# Patient Record
Sex: Male | Born: 1960 | Race: Black or African American | Hispanic: No | Marital: Married | Smoking: Never smoker
Health system: Southern US, Community
[De-identification: ages and names within clinical notes are randomized; demographics above are authoritative.]

## PROBLEM LIST (undated history)

## (undated) DIAGNOSIS — E119 Type 2 diabetes mellitus without complications: Secondary | ICD-10-CM

## (undated) DIAGNOSIS — K219 Gastro-esophageal reflux disease without esophagitis: Secondary | ICD-10-CM

## (undated) DIAGNOSIS — E782 Mixed hyperlipidemia: Secondary | ICD-10-CM

## (undated) DIAGNOSIS — I2510 Atherosclerotic heart disease of native coronary artery without angina pectoris: Secondary | ICD-10-CM

## (undated) DIAGNOSIS — I480 Paroxysmal atrial fibrillation: Secondary | ICD-10-CM

## (undated) DIAGNOSIS — G4730 Sleep apnea, unspecified: Secondary | ICD-10-CM

## (undated) DIAGNOSIS — I10 Essential (primary) hypertension: Secondary | ICD-10-CM

## (undated) DIAGNOSIS — M109 Gout, unspecified: Secondary | ICD-10-CM

## (undated) DIAGNOSIS — I429 Cardiomyopathy, unspecified: Secondary | ICD-10-CM

## (undated) DIAGNOSIS — I499 Cardiac arrhythmia, unspecified: Secondary | ICD-10-CM

## (undated) HISTORY — DX: Mixed hyperlipidemia: E78.2

## (undated) HISTORY — DX: Type 2 diabetes mellitus without complications: E11.9

## (undated) HISTORY — DX: Cardiomyopathy, unspecified: I42.9

## (undated) HISTORY — DX: Paroxysmal atrial fibrillation: I48.0

## (undated) HISTORY — DX: Essential (primary) hypertension: I10

## (undated) HISTORY — PX: BUNIONECTOMY: SHX129

---

## 2002-12-20 ENCOUNTER — Ambulatory Visit (HOSPITAL_COMMUNITY): Admission: RE | Admit: 2002-12-20 | Discharge: 2002-12-20 | Payer: Self-pay | Admitting: Podiatry

## 2004-12-15 ENCOUNTER — Encounter (INDEPENDENT_AMBULATORY_CARE_PROVIDER_SITE_OTHER): Payer: Self-pay | Admitting: Family Medicine

## 2005-12-14 ENCOUNTER — Ambulatory Visit: Payer: Self-pay | Admitting: Family Medicine

## 2005-12-14 ENCOUNTER — Ambulatory Visit (HOSPITAL_COMMUNITY): Admission: RE | Admit: 2005-12-14 | Discharge: 2005-12-14 | Payer: Self-pay | Admitting: Family Medicine

## 2005-12-28 ENCOUNTER — Encounter (INDEPENDENT_AMBULATORY_CARE_PROVIDER_SITE_OTHER): Payer: Self-pay | Admitting: Family Medicine

## 2005-12-31 ENCOUNTER — Ambulatory Visit: Payer: Self-pay | Admitting: Family Medicine

## 2006-01-14 ENCOUNTER — Encounter (INDEPENDENT_AMBULATORY_CARE_PROVIDER_SITE_OTHER): Payer: Self-pay | Admitting: Family Medicine

## 2006-01-28 ENCOUNTER — Ambulatory Visit: Payer: Self-pay | Admitting: Family Medicine

## 2006-04-05 ENCOUNTER — Encounter (INDEPENDENT_AMBULATORY_CARE_PROVIDER_SITE_OTHER): Payer: Self-pay | Admitting: Family Medicine

## 2006-04-05 LAB — CONVERTED CEMR LAB: PSA: 0.51 ng/mL

## 2006-04-27 ENCOUNTER — Encounter: Payer: Self-pay | Admitting: Family Medicine

## 2006-04-27 DIAGNOSIS — E669 Obesity, unspecified: Secondary | ICD-10-CM

## 2006-04-27 DIAGNOSIS — M109 Gout, unspecified: Secondary | ICD-10-CM

## 2006-04-27 DIAGNOSIS — I10 Essential (primary) hypertension: Secondary | ICD-10-CM

## 2006-04-27 DIAGNOSIS — E785 Hyperlipidemia, unspecified: Secondary | ICD-10-CM | POA: Insufficient documentation

## 2006-05-10 ENCOUNTER — Ambulatory Visit: Payer: Self-pay | Admitting: Family Medicine

## 2006-05-11 ENCOUNTER — Encounter (INDEPENDENT_AMBULATORY_CARE_PROVIDER_SITE_OTHER): Payer: Self-pay | Admitting: Family Medicine

## 2006-08-30 ENCOUNTER — Telehealth (INDEPENDENT_AMBULATORY_CARE_PROVIDER_SITE_OTHER): Payer: Self-pay | Admitting: Family Medicine

## 2006-08-31 ENCOUNTER — Ambulatory Visit: Payer: Self-pay | Admitting: Family Medicine

## 2006-08-31 ENCOUNTER — Ambulatory Visit (HOSPITAL_COMMUNITY): Admission: RE | Admit: 2006-08-31 | Discharge: 2006-08-31 | Payer: Self-pay | Admitting: Family Medicine

## 2006-08-31 ENCOUNTER — Telehealth (INDEPENDENT_AMBULATORY_CARE_PROVIDER_SITE_OTHER): Payer: Self-pay | Admitting: *Deleted

## 2006-09-01 ENCOUNTER — Ambulatory Visit (HOSPITAL_COMMUNITY): Admission: RE | Admit: 2006-09-01 | Discharge: 2006-09-01 | Payer: Self-pay | Admitting: Family Medicine

## 2006-09-03 ENCOUNTER — Encounter (INDEPENDENT_AMBULATORY_CARE_PROVIDER_SITE_OTHER): Payer: Self-pay | Admitting: Family Medicine

## 2006-09-06 ENCOUNTER — Encounter (INDEPENDENT_AMBULATORY_CARE_PROVIDER_SITE_OTHER): Payer: Self-pay | Admitting: Family Medicine

## 2006-09-14 ENCOUNTER — Ambulatory Visit: Payer: Self-pay | Admitting: Family Medicine

## 2006-09-14 LAB — CONVERTED CEMR LAB: Cholesterol, target level: 200 mg/dL

## 2006-10-15 ENCOUNTER — Encounter (INDEPENDENT_AMBULATORY_CARE_PROVIDER_SITE_OTHER): Payer: Self-pay | Admitting: Family Medicine

## 2006-10-21 ENCOUNTER — Encounter (INDEPENDENT_AMBULATORY_CARE_PROVIDER_SITE_OTHER): Payer: Self-pay | Admitting: Family Medicine

## 2006-10-25 ENCOUNTER — Ambulatory Visit: Payer: Self-pay | Admitting: Family Medicine

## 2007-05-17 ENCOUNTER — Ambulatory Visit: Payer: Self-pay | Admitting: Family Medicine

## 2007-05-30 ENCOUNTER — Encounter (INDEPENDENT_AMBULATORY_CARE_PROVIDER_SITE_OTHER): Payer: Self-pay | Admitting: Family Medicine

## 2007-05-31 ENCOUNTER — Telehealth (INDEPENDENT_AMBULATORY_CARE_PROVIDER_SITE_OTHER): Payer: Self-pay | Admitting: *Deleted

## 2007-05-31 LAB — CONVERTED CEMR LAB
AST: 14 units/L (ref 0–37)
Albumin: 4.4 g/dL (ref 3.5–5.2)
Alkaline Phosphatase: 90 units/L (ref 39–117)
Basophils Absolute: 0 10*3/uL (ref 0.0–0.1)
Basophils Relative: 0 % (ref 0–1)
Eosinophils Absolute: 0.2 10*3/uL (ref 0.0–0.7)
Eosinophils Relative: 1 % (ref 0–5)
Glucose, Bld: 107 mg/dL — ABNORMAL HIGH (ref 70–99)
HCT: 45.1 % (ref 39.0–52.0)
LDL Cholesterol: 96 mg/dL (ref 0–99)
Lymphs Abs: 2.1 10*3/uL (ref 0.7–4.0)
MCHC: 33.3 g/dL (ref 30.0–36.0)
MCV: 90.6 fL (ref 78.0–100.0)
Neutrophils Relative %: 71 % (ref 43–77)
PSA: 0.45 ng/mL (ref 0.10–4.00)
Platelets: 230 10*3/uL (ref 150–400)
Potassium: 4 meq/L (ref 3.5–5.3)
RDW: 13 % (ref 11.5–15.5)
Sodium: 141 meq/L (ref 135–145)
Total Bilirubin: 0.7 mg/dL (ref 0.3–1.2)
Total Protein: 7.7 g/dL (ref 6.0–8.3)
Triglycerides: 169 mg/dL — ABNORMAL HIGH (ref <150)
Uric Acid, Serum: 8.2 mg/dL — ABNORMAL HIGH (ref 4.0–7.8)
VLDL: 34 mg/dL (ref 0–40)
WBC: 10.4 10*3/uL (ref 4.0–10.5)

## 2007-06-14 ENCOUNTER — Telehealth (INDEPENDENT_AMBULATORY_CARE_PROVIDER_SITE_OTHER): Payer: Self-pay | Admitting: *Deleted

## 2007-06-14 ENCOUNTER — Ambulatory Visit: Payer: Self-pay | Admitting: Family Medicine

## 2007-06-14 DIAGNOSIS — R7309 Other abnormal glucose: Secondary | ICD-10-CM | POA: Insufficient documentation

## 2007-06-17 ENCOUNTER — Encounter (INDEPENDENT_AMBULATORY_CARE_PROVIDER_SITE_OTHER): Payer: Self-pay | Admitting: Family Medicine

## 2007-07-26 ENCOUNTER — Ambulatory Visit: Payer: Self-pay | Admitting: Family Medicine

## 2007-07-27 ENCOUNTER — Encounter (INDEPENDENT_AMBULATORY_CARE_PROVIDER_SITE_OTHER): Payer: Self-pay | Admitting: Family Medicine

## 2007-07-27 ENCOUNTER — Telehealth (INDEPENDENT_AMBULATORY_CARE_PROVIDER_SITE_OTHER): Payer: Self-pay | Admitting: *Deleted

## 2007-07-27 LAB — CONVERTED CEMR LAB
CO2: 21 meq/L (ref 19–32)
Calcium: 9.3 mg/dL (ref 8.4–10.5)
Creatinine, Ser: 0.98 mg/dL (ref 0.40–1.50)
Glucose, Bld: 100 mg/dL — ABNORMAL HIGH (ref 70–99)
Uric Acid, Serum: 7.1 mg/dL (ref 4.0–7.8)

## 2007-08-29 ENCOUNTER — Ambulatory Visit: Payer: Self-pay | Admitting: Family Medicine

## 2007-08-29 ENCOUNTER — Telehealth (INDEPENDENT_AMBULATORY_CARE_PROVIDER_SITE_OTHER): Payer: Self-pay | Admitting: *Deleted

## 2007-08-29 DIAGNOSIS — R0989 Other specified symptoms and signs involving the circulatory and respiratory systems: Secondary | ICD-10-CM

## 2007-08-29 DIAGNOSIS — R0609 Other forms of dyspnea: Secondary | ICD-10-CM | POA: Insufficient documentation

## 2007-08-31 ENCOUNTER — Ambulatory Visit (HOSPITAL_COMMUNITY): Admission: RE | Admit: 2007-08-31 | Discharge: 2007-08-31 | Payer: Self-pay | Admitting: Family Medicine

## 2007-09-01 ENCOUNTER — Telehealth (INDEPENDENT_AMBULATORY_CARE_PROVIDER_SITE_OTHER): Payer: Self-pay | Admitting: *Deleted

## 2007-09-02 ENCOUNTER — Encounter (INDEPENDENT_AMBULATORY_CARE_PROVIDER_SITE_OTHER): Payer: Self-pay | Admitting: Family Medicine

## 2007-09-06 ENCOUNTER — Encounter (INDEPENDENT_AMBULATORY_CARE_PROVIDER_SITE_OTHER): Payer: Self-pay | Admitting: Family Medicine

## 2007-09-15 LAB — CONVERTED CEMR LAB
Catecholamines Tot(E+NE) 24 Hr U: 0.136 mg/24hr — ABNORMAL HIGH
Creatinine, Urine: 170.3 mg/dL
Dopamine 24 Hr Urine: 685 mcg/24hr — ABNORMAL HIGH (ref <500)
Epinephrine 24 Hr Urine: 13 mcg/24hr (ref <20)
Volume, Urine-CORTUR: 1600 mL

## 2007-09-16 ENCOUNTER — Encounter (INDEPENDENT_AMBULATORY_CARE_PROVIDER_SITE_OTHER): Payer: Self-pay | Admitting: Family Medicine

## 2007-09-16 ENCOUNTER — Telehealth (INDEPENDENT_AMBULATORY_CARE_PROVIDER_SITE_OTHER): Payer: Self-pay | Admitting: Family Medicine

## 2007-09-16 DIAGNOSIS — R809 Proteinuria, unspecified: Secondary | ICD-10-CM | POA: Insufficient documentation

## 2007-09-26 ENCOUNTER — Ambulatory Visit: Payer: Self-pay | Admitting: Family Medicine

## 2007-09-30 ENCOUNTER — Encounter (INDEPENDENT_AMBULATORY_CARE_PROVIDER_SITE_OTHER): Payer: Self-pay | Admitting: Internal Medicine

## 2007-10-11 ENCOUNTER — Telehealth (INDEPENDENT_AMBULATORY_CARE_PROVIDER_SITE_OTHER): Payer: Self-pay | Admitting: Family Medicine

## 2007-10-18 ENCOUNTER — Encounter (INDEPENDENT_AMBULATORY_CARE_PROVIDER_SITE_OTHER): Payer: Self-pay | Admitting: Family Medicine

## 2007-11-04 ENCOUNTER — Encounter (INDEPENDENT_AMBULATORY_CARE_PROVIDER_SITE_OTHER): Payer: Self-pay | Admitting: Family Medicine

## 2007-11-16 ENCOUNTER — Telehealth (INDEPENDENT_AMBULATORY_CARE_PROVIDER_SITE_OTHER): Payer: Self-pay | Admitting: Family Medicine

## 2007-11-16 ENCOUNTER — Encounter (INDEPENDENT_AMBULATORY_CARE_PROVIDER_SITE_OTHER): Payer: Self-pay | Admitting: Family Medicine

## 2007-12-06 ENCOUNTER — Ambulatory Visit: Payer: Self-pay | Admitting: Family Medicine

## 2007-12-13 ENCOUNTER — Encounter (INDEPENDENT_AMBULATORY_CARE_PROVIDER_SITE_OTHER): Payer: Self-pay | Admitting: Family Medicine

## 2007-12-23 ENCOUNTER — Encounter (INDEPENDENT_AMBULATORY_CARE_PROVIDER_SITE_OTHER): Payer: Self-pay | Admitting: Family Medicine

## 2007-12-27 ENCOUNTER — Ambulatory Visit: Payer: Self-pay | Admitting: Family Medicine

## 2007-12-30 ENCOUNTER — Ambulatory Visit: Payer: Self-pay | Admitting: Family Medicine

## 2008-01-24 ENCOUNTER — Ambulatory Visit: Payer: Self-pay | Admitting: Family Medicine

## 2008-01-26 ENCOUNTER — Encounter (INDEPENDENT_AMBULATORY_CARE_PROVIDER_SITE_OTHER): Payer: Self-pay | Admitting: Family Medicine

## 2008-01-26 LAB — CONVERTED CEMR LAB
BUN: 12 mg/dL (ref 6–23)
CO2: 21 meq/L (ref 19–32)
Glucose, Bld: 89 mg/dL (ref 70–99)
Potassium: 3.7 meq/L (ref 3.5–5.3)
Sodium: 139 meq/L (ref 135–145)

## 2008-02-22 ENCOUNTER — Ambulatory Visit: Payer: Self-pay | Admitting: Internal Medicine

## 2008-02-22 ENCOUNTER — Encounter (INDEPENDENT_AMBULATORY_CARE_PROVIDER_SITE_OTHER): Payer: Self-pay | Admitting: Family Medicine

## 2008-02-23 LAB — CONVERTED CEMR LAB: Monocyte/Macrophage: 2 % — ABNORMAL LOW (ref 50–90)

## 2008-02-28 ENCOUNTER — Ambulatory Visit: Payer: Self-pay | Admitting: Internal Medicine

## 2008-04-24 ENCOUNTER — Ambulatory Visit: Payer: Self-pay | Admitting: Family Medicine

## 2008-05-08 ENCOUNTER — Encounter (INDEPENDENT_AMBULATORY_CARE_PROVIDER_SITE_OTHER): Payer: Self-pay | Admitting: Family Medicine

## 2008-05-22 ENCOUNTER — Ambulatory Visit: Payer: Self-pay | Admitting: Family Medicine

## 2008-05-25 ENCOUNTER — Encounter (INDEPENDENT_AMBULATORY_CARE_PROVIDER_SITE_OTHER): Payer: Self-pay | Admitting: Family Medicine

## 2008-05-28 LAB — CONVERTED CEMR LAB
ALT: 28 units/L (ref 0–53)
Basophils Absolute: 0 10*3/uL (ref 0.0–0.1)
CO2: 22 meq/L (ref 19–32)
Calcium: 8.9 mg/dL (ref 8.4–10.5)
Chloride: 106 meq/L (ref 96–112)
Cholesterol: 165 mg/dL (ref 0–200)
Creatinine, Ser: 0.8 mg/dL (ref 0.40–1.50)
Glucose, Bld: 133 mg/dL — ABNORMAL HIGH (ref 70–99)
Hemoglobin: 14.5 g/dL (ref 13.0–17.0)
Lymphocytes Relative: 22 % (ref 12–46)
Lymphs Abs: 2.1 10*3/uL (ref 0.7–4.0)
Monocytes Absolute: 0.6 10*3/uL (ref 0.1–1.0)
Monocytes Relative: 7 % (ref 3–12)
Neutro Abs: 6.7 10*3/uL (ref 1.7–7.7)
PSA: 0.44 ng/mL (ref 0.10–4.00)
RBC: 4.85 M/uL (ref 4.22–5.81)
RDW: 13.8 % (ref 11.5–15.5)
TSH: 1.464 microintl units/mL (ref 0.350–4.50)
Total CHOL/HDL Ratio: 3.1
WBC: 9.7 10*3/uL (ref 4.0–10.5)

## 2008-06-22 ENCOUNTER — Ambulatory Visit: Payer: Self-pay | Admitting: Family Medicine

## 2008-06-22 LAB — CONVERTED CEMR LAB: Hgb A1c MFr Bld: 6.3 %

## 2008-07-20 ENCOUNTER — Ambulatory Visit: Payer: Self-pay | Admitting: Family Medicine

## 2008-07-20 LAB — CONVERTED CEMR LAB
Bilirubin Urine: NEGATIVE
Blood in Urine, dipstick: NEGATIVE
Urobilinogen, UA: 1
pH: 5.5

## 2008-07-25 ENCOUNTER — Encounter (INDEPENDENT_AMBULATORY_CARE_PROVIDER_SITE_OTHER): Payer: Self-pay | Admitting: Family Medicine

## 2008-07-30 ENCOUNTER — Encounter (INDEPENDENT_AMBULATORY_CARE_PROVIDER_SITE_OTHER): Payer: Self-pay | Admitting: Family Medicine

## 2008-07-31 ENCOUNTER — Encounter (INDEPENDENT_AMBULATORY_CARE_PROVIDER_SITE_OTHER): Payer: Self-pay | Admitting: Family Medicine

## 2008-08-17 ENCOUNTER — Ambulatory Visit: Payer: Self-pay | Admitting: Family Medicine

## 2008-08-17 DIAGNOSIS — G4736 Sleep related hypoventilation in conditions classified elsewhere: Secondary | ICD-10-CM

## 2008-08-18 ENCOUNTER — Encounter (INDEPENDENT_AMBULATORY_CARE_PROVIDER_SITE_OTHER): Payer: Self-pay | Admitting: Family Medicine

## 2008-08-20 ENCOUNTER — Encounter (INDEPENDENT_AMBULATORY_CARE_PROVIDER_SITE_OTHER): Payer: Self-pay | Admitting: *Deleted

## 2008-08-20 LAB — CONVERTED CEMR LAB
Calcium: 9.2 mg/dL (ref 8.4–10.5)
Creatinine, Ser: 0.9 mg/dL (ref 0.40–1.50)

## 2008-09-07 ENCOUNTER — Ambulatory Visit: Admission: RE | Admit: 2008-09-07 | Discharge: 2008-09-07 | Payer: Self-pay | Admitting: Family Medicine

## 2008-09-07 ENCOUNTER — Encounter (INDEPENDENT_AMBULATORY_CARE_PROVIDER_SITE_OTHER): Payer: Self-pay | Admitting: Family Medicine

## 2008-09-14 ENCOUNTER — Ambulatory Visit: Payer: Self-pay | Admitting: Family Medicine

## 2008-09-17 ENCOUNTER — Ambulatory Visit: Payer: Self-pay | Admitting: Pulmonary Disease

## 2008-09-17 ENCOUNTER — Encounter (INDEPENDENT_AMBULATORY_CARE_PROVIDER_SITE_OTHER): Payer: Self-pay | Admitting: Family Medicine

## 2008-09-18 ENCOUNTER — Telehealth (INDEPENDENT_AMBULATORY_CARE_PROVIDER_SITE_OTHER): Payer: Self-pay | Admitting: *Deleted

## 2008-09-18 ENCOUNTER — Encounter (INDEPENDENT_AMBULATORY_CARE_PROVIDER_SITE_OTHER): Payer: Self-pay | Admitting: Family Medicine

## 2008-09-24 ENCOUNTER — Encounter (INDEPENDENT_AMBULATORY_CARE_PROVIDER_SITE_OTHER): Payer: Self-pay | Admitting: Family Medicine

## 2008-09-26 ENCOUNTER — Encounter (INDEPENDENT_AMBULATORY_CARE_PROVIDER_SITE_OTHER): Payer: Self-pay | Admitting: Family Medicine

## 2008-09-27 ENCOUNTER — Encounter (INDEPENDENT_AMBULATORY_CARE_PROVIDER_SITE_OTHER): Payer: Self-pay | Admitting: Family Medicine

## 2008-10-05 ENCOUNTER — Encounter (INDEPENDENT_AMBULATORY_CARE_PROVIDER_SITE_OTHER): Payer: Self-pay | Admitting: Family Medicine

## 2008-10-26 ENCOUNTER — Ambulatory Visit: Payer: Self-pay | Admitting: Family Medicine

## 2008-10-27 ENCOUNTER — Encounter (INDEPENDENT_AMBULATORY_CARE_PROVIDER_SITE_OTHER): Payer: Self-pay | Admitting: Family Medicine

## 2008-10-30 LAB — CONVERTED CEMR LAB
ALT: 28 units/L (ref 0–53)
AST: 22 units/L (ref 0–37)
Albumin: 4.2 g/dL (ref 3.5–5.2)
Calcium: 9.4 mg/dL (ref 8.4–10.5)
Chloride: 107 meq/L (ref 96–112)
Potassium: 4.1 meq/L (ref 3.5–5.3)
Sodium: 139 meq/L (ref 135–145)

## 2008-12-04 ENCOUNTER — Encounter (INDEPENDENT_AMBULATORY_CARE_PROVIDER_SITE_OTHER): Payer: Self-pay | Admitting: Family Medicine

## 2008-12-07 ENCOUNTER — Telehealth (INDEPENDENT_AMBULATORY_CARE_PROVIDER_SITE_OTHER): Payer: Self-pay | Admitting: Family Medicine

## 2008-12-07 ENCOUNTER — Ambulatory Visit: Payer: Self-pay | Admitting: Family Medicine

## 2009-01-04 ENCOUNTER — Encounter (INDEPENDENT_AMBULATORY_CARE_PROVIDER_SITE_OTHER): Payer: Self-pay | Admitting: Family Medicine

## 2010-06-08 LAB — CONVERTED CEMR LAB
RBC count: 4.93 10*6/uL
WBC, blood: 17.47 10*3/uL

## 2010-09-03 ENCOUNTER — Inpatient Hospital Stay (HOSPITAL_COMMUNITY)
Admission: EM | Admit: 2010-09-03 | Discharge: 2010-09-06 | DRG: 554 | Disposition: A | Discharge status: Home / Self Care | Payer: Self-pay | Attending: Internal Medicine | Admitting: Internal Medicine

## 2010-09-03 ENCOUNTER — Emergency Department (HOSPITAL_COMMUNITY): Payer: Self-pay

## 2010-09-03 DIAGNOSIS — E876 Hypokalemia: Secondary | ICD-10-CM | POA: Diagnosis present

## 2010-09-03 DIAGNOSIS — E785 Hyperlipidemia, unspecified: Secondary | ICD-10-CM | POA: Diagnosis present

## 2010-09-03 DIAGNOSIS — M109 Gout, unspecified: Principal | ICD-10-CM | POA: Diagnosis present

## 2010-09-03 DIAGNOSIS — Z91199 Patient's noncompliance with other medical treatment and regimen due to unspecified reason: Secondary | ICD-10-CM

## 2010-09-03 DIAGNOSIS — I10 Essential (primary) hypertension: Secondary | ICD-10-CM | POA: Diagnosis present

## 2010-09-03 DIAGNOSIS — Z9119 Patient's noncompliance with other medical treatment and regimen: Secondary | ICD-10-CM

## 2010-09-03 DIAGNOSIS — E119 Type 2 diabetes mellitus without complications: Secondary | ICD-10-CM | POA: Diagnosis present

## 2010-09-03 DIAGNOSIS — G4733 Obstructive sleep apnea (adult) (pediatric): Secondary | ICD-10-CM | POA: Diagnosis present

## 2010-09-03 DIAGNOSIS — N179 Acute kidney failure, unspecified: Secondary | ICD-10-CM | POA: Diagnosis not present

## 2010-09-03 LAB — DIFFERENTIAL
Basophils Relative: 0 % (ref 0–1)
Lymphs Abs: 2.1 10*3/uL (ref 0.7–4.0)
Monocytes Relative: 10 % (ref 3–12)
Neutro Abs: 16.5 10*3/uL — ABNORMAL HIGH (ref 1.7–7.7)

## 2010-09-03 LAB — BASIC METABOLIC PANEL
BUN: 11 mg/dL (ref 6–23)
Creatinine, Ser: 1.04 mg/dL (ref 0.4–1.5)
GFR calc non Af Amer: >60 mL/min (ref >60)

## 2010-09-03 LAB — URINALYSIS, ROUTINE W REFLEX MICROSCOPIC
Ketones, ur: NEGATIVE mg/dL
Nitrite: NEGATIVE
Protein, ur: >300 mg/dL — AB
Urobilinogen, UA: 1 mg/dL (ref 0.0–1.0)

## 2010-09-03 LAB — LACTIC ACID, PLASMA: Lactic Acid, Venous: 1.5 mmol/L (ref 0.5–2.2)

## 2010-09-03 LAB — SYNOVIAL CELL COUNT + DIFF, W/ CRYSTALS
Eosinophils-Synovial: 0 % (ref 0–1)
Monocyte-Macrophage-Synovial Fluid: 2 % — ABNORMAL LOW (ref 50–90)

## 2010-09-03 LAB — URINE MICROSCOPIC-ADD ON

## 2010-09-03 LAB — CBC
Hemoglobin: 14.7 g/dL (ref 13.0–17.0)
MCV: 89 fL (ref 78.0–100.0)
Platelets: 218 10*3/uL (ref 150–400)
RBC: 4.89 MIL/uL (ref 4.22–5.81)
WBC: 20.7 10*3/uL — ABNORMAL HIGH (ref 4.0–10.5)

## 2010-09-04 LAB — DIFFERENTIAL
Basophils Relative: 0 % (ref 0–1)
Eosinophils Absolute: 0.1 10*3/uL (ref 0.0–0.7)
Monocytes Absolute: 1.7 10*3/uL — ABNORMAL HIGH (ref 0.1–1.0)
Monocytes Relative: 12 % (ref 3–12)

## 2010-09-04 LAB — BASIC METABOLIC PANEL
BUN: 15 mg/dL (ref 6–23)
CO2: 26 mEq/L (ref 19–32)
Chloride: 101 mEq/L (ref 96–112)
Creatinine, Ser: 1.36 mg/dL (ref 0.4–1.5)

## 2010-09-04 LAB — CBC
MCH: 29.9 pg (ref 26.0–34.0)
MCHC: 33.2 g/dL (ref 30.0–36.0)
Platelets: 181 10*3/uL (ref 150–400)
RDW: 13.4 % (ref 11.5–15.5)

## 2010-09-04 LAB — GLUCOSE, CAPILLARY
Glucose-Capillary: 132 mg/dL — ABNORMAL HIGH (ref 70–99)
Glucose-Capillary: 124 mg/dL — ABNORMAL HIGH (ref 70–99)
Glucose-Capillary: 113 mg/dL — ABNORMAL HIGH (ref 70–99)

## 2010-09-04 LAB — CARDIAC PANEL(CRET KIN+CKTOT+MB+TROPI)
Relative Index: 0.8 (ref 0.0–2.5)
Relative Index: 1 (ref 0.0–2.5)
Troponin I: 0.03 ng/mL (ref 0.00–0.06)
Troponin I: 0.05 ng/mL (ref 0.00–0.06)

## 2010-09-05 LAB — DIFFERENTIAL
Basophils Absolute: 0 10*3/uL (ref 0.0–0.1)
Eosinophils Absolute: 0.3 10*3/uL (ref 0.0–0.7)
Lymphs Abs: 2.9 10*3/uL (ref 0.7–4.0)
Monocytes Relative: 11 % (ref 3–12)
Neutro Abs: 8.9 10*3/uL — ABNORMAL HIGH (ref 1.7–7.7)

## 2010-09-05 LAB — CBC
Hemoglobin: 12.4 g/dL — ABNORMAL LOW (ref 13.0–17.0)
MCH: 30 pg (ref 26.0–34.0)
MCHC: 33.2 g/dL (ref 30.0–36.0)

## 2010-09-05 LAB — GLUCOSE, CAPILLARY: Glucose-Capillary: 119 mg/dL — ABNORMAL HIGH (ref 70–99)

## 2010-09-05 LAB — BASIC METABOLIC PANEL
BUN: 33 mg/dL — ABNORMAL HIGH (ref 6–23)
CO2: 23 mEq/L (ref 19–32)
Calcium: 8.7 mg/dL (ref 8.4–10.5)
Creatinine, Ser: 1.6 mg/dL — ABNORMAL HIGH (ref 0.4–1.5)
GFR calc Af Amer: 56 mL/min — ABNORMAL LOW (ref >60)

## 2010-09-05 LAB — LIPID PANEL: Cholesterol: 154 mg/dL (ref 0–200)

## 2010-09-06 LAB — BASIC METABOLIC PANEL WITH GFR
BUN: 21 mg/dL (ref 6–23)
CO2: 25 meq/L (ref 19–32)
Calcium: 8.9 mg/dL (ref 8.4–10.5)
Chloride: 105 meq/L (ref 96–112)
Creatinine, Ser: 1.06 mg/dL (ref 0.4–1.5)
GFR calc non Af Amer: >60 mL/min
Glucose, Bld: 117 mg/dL — ABNORMAL HIGH (ref 70–99)
Potassium: 3.5 meq/L (ref 3.5–5.1)
Sodium: 138 meq/L (ref 135–145)

## 2010-09-06 LAB — CBC
MCH: 30 pg (ref 26.0–34.0)
Platelets: 237 10*3/uL (ref 150–400)
RBC: 4.43 MIL/uL (ref 4.22–5.81)
RDW: 13.2 % (ref 11.5–15.5)
WBC: 12.5 10*3/uL — ABNORMAL HIGH (ref 4.0–10.5)

## 2010-09-06 LAB — DIFFERENTIAL
Basophils Absolute: 0 K/uL (ref 0.0–0.1)
Basophils Relative: 0 % (ref 0–1)
Eosinophils Absolute: 0.3 K/uL (ref 0.0–0.7)
Eosinophils Relative: 2 % (ref 0–5)
Lymphocytes Relative: 20 % (ref 12–46)
Lymphs Abs: 2.5 K/uL (ref 0.7–4.0)
Monocytes Absolute: 1.1 K/uL — ABNORMAL HIGH (ref 0.1–1.0)
Monocytes Relative: 9 % (ref 3–12)
Neutro Abs: 8.6 K/uL — ABNORMAL HIGH (ref 1.7–7.7)
Neutrophils Relative %: 69 % (ref 43–77)

## 2010-09-06 LAB — GLUCOSE, CAPILLARY: Glucose-Capillary: 118 mg/dL — ABNORMAL HIGH (ref 70–99)

## 2010-09-06 NOTE — H&P (Signed)
NAMEEMERALD, GEHRES          ACCOUNT NO.:  [id number]  MEDICAL RECORD NO.:  [id number]           PATIENT TYPE:  I  LOCATION:  A336                          FACILITY:  [facility]  PHYSICIAN:  Tarry Kos, MD       DATE OF BIRTH:  1961/02/22  DATE OF ADMISSION:  09/03/2010 DATE OF DISCHARGE:  LH                             HISTORY & PHYSICAL   CHIEF COMPLAINT:  Left knee pain.  HISTORY OF PRESENT ILLNESS:  Mr. Tinnie Gens is a 50 year old African American male who has a history of a high blood pressure, gout, non- insulin-dependent diabetes, sleep apnea, hypertension and hyperlipidemia who presents to the emergency department because of left knee pain from his primary care physician's office.  He had systolic blood pressures round 200 and was told to come to the emergency department.  He was diagnosed with a gouty flare in his left knee.  However, due to his uncontrolled blood pressure and tachycardia, we were asked to admit the patient.  He ran out of his blood pressure medications less than a week ago.  He denies any fevers.  He states he gets gout frequently usually in his big toe, but he has had it in the same knee several times.  His knee was tapped in emergency department and it is consistent with a gouty flare and it has crystals in the synovial fluid.  REVIEW OF SYSTEMS:  Otherwise, negative.  PAST MEDICAL HISTORY:  Hypokalemia, non-insulin-dependent diabetes, malignant hypertension with a history of medical noncompliance, chronic gout, sleep apnea, hyperlipidemia and hypertension.  MEDICATIONS: 1. He is on Eucerin plus twice a day. 2. Clonidine 0.3 mg 3 times a day. 3. Pravachol 40 mg nightly. 4. Aspirin 81 mg a day. 5. Allopurinol 100 mg a day. 6. Norvasc 10 mg a day. 7. Coreg 25 mg twice a day. 8. Lisinopril/hydrochlorothiazide 20/12.5 mg 2 p.o. daily. 9. Metformin 1000 mg daily. 10.Hydralazine 50 mg 3 times a day.  ALLERGIES:  None.  SOCIAL HISTORY:  Nonsmoker.   No alcohol.  No IV drug abuse.  PHYSICAL EXAMINATION:  VITALS:  Temperature 100.1, current 98.6, blood pressure as high as 214/149, pulse between 100 and 130, respirations 18 and O2 sats 94% on room air. GENERAL:  Alert and oriented x4.  No apparent distress, cooperative, and friendly. COR:  Regular rate and rhythm without murmurs, rubs, or gallops. CHEST:  Clear to auscultation bilaterally.  No wheeze, rhonchi, or rales. ABDOMEN:  Soft, nontender and nondistended.  Positive bowel sounds.  No hepatosplenomegaly. EXTREMITIES:  No clubbing, cyanosis or edema.  He has got a left knee swelling with no surrounding erythema and is not warm to touch. SKIN:  No rashes. PSYCH:  Normal affect.  LABORATORY DATA:  Potassium is 3.3.  BUN and creatinine normal.  Lactic acid 1.5.  Sed rate is 48.  CBC; white count is 20.7, hemoglobin is normal.  Fluid from knee aspirate on the left, no organisms seen.  White blood cells present.  Urate crystals identified.  Urinalysis specific gravity over 1.030.  Negative nitrite, negative for leukocytes.  EKG sinus tachycardia.  ASSESSMENT AND PLAN:  This is a 50 year old  male with left knee pain and uncontrolled hypertension. 1. Left knee swelling likely secondary to acute gouty flare.  We will     start him on colchicine and hold his allopurinol at this time. 2. Uncontrolled malignant hypertension secondary to medical     noncompliance.  We will put him back on his Coreg, clonidine,     Norvasc, lisinopril, hydrochlorothiazide and hydralazine.  He ran     out of his meds less than a week ago due to cost issues. 3. Non-insulin dependent diabetes.  Continue his metformin. 4. Orthopedic surgery was called by the ED, but I am not sure that     they were actually formally consulted.  This may be need to be     reconsidered in the morning to get their official opinion as to     whether or not this is a septic knee or not.  It does not appear     that it is. We  will treat his gouty flare.  He did get one dose of     vancomycin in the emergency department.  The fluid aspirate from     his knee needs to be followed up on because culture has been done.     Further recommendations depending on overall hospital course.                                           ______________________________ Tarry Kos, MD     RD/MEDQ  D:  09/03/2010  T:  09/04/2010  Job:  [id number]  Electronically Signed by Tarry Kos MD on 09/06/2010 09:48:53 PM

## 2010-09-07 LAB — BODY FLUID CULTURE

## 2010-09-07 NOTE — Discharge Summary (Signed)
[name], [name]          ACCOUNT NO.:  [id number]  MEDICAL RECORD NO.:  [id number]           PATIENT TYPE:  I  LOCATION:  A336                          FACILITY:  [facility]  PHYSICIAN:  Peggye Pitt, M.D. DATE OF BIRTH:  [birth year]  DATE OF ADMISSION:  09/03/2010 DATE OF DISCHARGE:  LH                              DISCHARGE SUMMARY   PATIENT'S PRIMARY CARE PHYSICIAN:  At the [facility] in [place].  DISCHARGE DIAGNOSES: 1. Acute gout flare of the left knee, improved. 2. Hypertensive urgency secondary to noncompliance with medications. 3. Chronic hypokalemia. 4. Acute renal failure, related to ACE inhibitor, resolved. 5. Leukocytosis secondary to gouty flare, improved. 6. Hyperlipidemia. 7. Type 2 diabetes mellitus. 8. Obstructive sleep apnea. 9. Morbid obesity.  DISCHARGE MEDICATIONS: 1. Colchicine 0.6 mg twice daily. 2. Potassium chloride 20 mEq daily. 3. Aspirin 81 mg daily. 4. Coreg 25 mg twice daily. 5. Eucerin cream twice daily to body. 6. Hydralazine 50 mg 3 times a day. 7. Metformin 1000 mg twice daily. 8. Norvasc 10 mg daily. 9. Pravachol 1 tablet at bedtime.  DISPOSITION AND FOLLOWUP:  [name] will be discharged home today in stable and improved condition.  He is instructed to follow up with his primary care physician, no later than 2 weeks for blood pressure check.  CONSULTATIONS THIS HOSPITALIZATION:  None.  IMAGES AND PROCEDURES: 1. Chest x-ray on September 03, 2010, with no acute cardiopulmonary     findings. 2. A left knee x-ray also on September 03, 2010, that showed mild     degenerative changes, but no acute bony findings with a large joint     effusion.  The patient also had a left knee arthrocentesis on September 03, 2010, performed by the emergency department.  HISTORY AND PHYSICAL:  For complete details, please refer to dictation by Dr. Onalee Hua on September 03, 2010, but in brief [name] is a very pleasant obese  50 year old African American gentleman with a history of high blood pressure, gout, type 2 diabetes and hyperlipidemia with a history of medical noncompliance who presented to his primary care physician because of left knee pain and was then referred to the emergency department because of elevated blood pressures.  He was found to have a blood pressure with a systolic over 200.  Because of these issues, we were asked to admit him for further evaluation.  HOSPITAL COURSE: 1. Acute gouty flare in the left knee.  Because of leukocytosis and     fever and concerns that the joint may be septic, the emergency     department did perform an arthrocentesis.  Culture data from this     arthrocentesis is negative to date.  However, on crystal     examination, he did have monosodium urate crystals consistent with     gout and a WBC count of 58,000.  He has been treated with     colchicine.  His knee pain has much improved at this point. 2. Hypertensive urgency.  Blood pressures were initially over 200     systolic and over 100 diastolic.  Once we started his  suppose at     home regimen for medications, he had a rapid decrease in blood     pressure to a systolic of 90.  This to me indicates that he has     been noncompliant with his medications at home.  I had initially     discontinued his hydrochlorothiazide and his clonidine and kept him     on Coreg and lisinopril.  This seemed to control his blood pressure     better.  However, 24 hours after initiation of lisinopril, he had a     rapid increase in creatinine from 1.2-1.6.  This prompted my     decision to discontinue his lisinopril and substituted for Norvasc.     On day of discharge, his blood pressure remains in the 150s-160     systolic.  I believe that there is no reason to further keep him in     the hospital to continue to lower his blood pressure.  This can be     achieved in the outpatient setting.  I have encouraged him to     secure  followup with his primary care physician within the next 2     weeks for blood pressure check and continued dosing of his blood     pressure medications.  3.  Acute renal failure.  Upon admission, he     had a creatinine of 1.2, this rapidly rose to 1.6 after initiation     of ACE inhibitor.  This has been discontinued and his creatinine is     now back to normal at 1.06. 3. Chronic hypokalemia.  The etiology for this is unclear to me.  His     magnesium level have been within normal limits.  He has been     discharged on 20 mEq daily, potassium. 4. Leukocytosis.  This is secondary to his acute gouty flare.  This     has improved from around 16,000-12,000 on day of discharge. 5. The rest of his chronic medical issues have been stable. 6. Vitals on day of discharge; blood pressure 156/90, heart rate 87,     respirations 20, sats of 98% on room air and temperature of 98.5.     Peggye Pitt, M.D.     EH/MEDQ  D:  09/06/2010  T:  09/06/2010  Job:  [id number]  Electronically Signed by Peggye Pitt M.D. on 09/07/2010 09:58:36 AM

## 2010-09-23 NOTE — Procedures (Signed)
[name], [name] NO.:  [id number]   MEDICAL RECORD NO.:  [id number]          PATIENT TYPE:  OUT   LOCATION:  SLEEP LAB                     FACILITY:  [facility]   PHYSICIAN:  Barbaraann Share, MD,FCCPDATE OF BIRTH:  February 11, 1961   DATE OF STUDY:  09/07/2008                            NOCTURNAL POLYSOMNOGRAM   REFERRING PHYSICIAN:   REFERRING PHYSICIAN:  Franchot Heidelberg, MD   INDICATIONS FOR STUDY:  Hypersomnia with sleep apnea.   EPWORTH SLEEPINESS SCORE:  2.   MEDICATIONS:   SLEEP ARCHITECTURE:  The patient had a total sleep time of 332 minutes  with small quantity of REM and slow-wave sleep primarily in the  titration portion of the split-night study.  Sleep onset latency was  normal at 8 minutes, and sleep efficiency was 66% during the diagnostic  portion and 82% during the titration portion.   RESPIRATORY DATA:  The patient underwent a split-night study where he  was found to have 239 obstructive events in the first 132 minutes of  sleep.  This gave him an apnea-hypopnea index of 109 events per hour  during the diagnostic portion of the study.  The events were worse in  the supine position.  There was very loud snoring noted throughout.  The  patient was then fitted with a large Respironics ComfortGel full-face  mask, and ultimately titrated to a maximum pressure of 21 cm of water.  Even on this pressure, the patient continued to have breakthrough  events, and was noted to have an AHI of 14 events per hour while in the  supine position on this pressure.  Unfortunately, the CPAP pressure is  unable to be titrated further.   OXYGEN DATA:  There was O2 desaturation as low as 74% with the patient's  obstructive events.   CARDIAC DATA:  No clinically significant arrhythmias were seen.   MOVEMENT-PARASOMNIA:  The patient had no leg jerks or other  abnormalities seen.   IMPRESSION-RECOMMENDATIONS:  Split-night study reveals very severe  obstructive sleep  apnea with an AHI of 109 events per hour and O2  desaturation as low as 74%.  The patient was then fitted with a large  Respironics ComfortGel full-face mask, and ultimately titrated to a  final pressure of 21 cm with persistent breakthrough events.  At this  point, I would initiate CPAP at 10 cm of water pressure and gradually  increase over 4-8 weeks to a level of  18 cm.  It is very difficult for the patient to tolerate pressure above  18 cm.  It will also be helpful for the patient to avoid the supine  position while sleeping, and also to work aggressively on weight loss.      Barbaraann Share, MD,FCCP  Diplomate, American Board of Sleep  Medicine  Electronically Signed     [facility]/MEDQ  D:  09/17/2008 05:51:31  T:  09/17/2008 06:03:05  Job:  [id number]

## 2010-09-26 NOTE — Op Note (Signed)
[name], [name]                    ACCOUNT NO.:  [id number]   MEDICAL RECORD NO.:  [id number]                   PATIENT TYPE:  AMB   LOCATION:  DAY                                  FACILITY:  [facility]   PHYSICIAN:  Cody M. Ulice Brilliant, D.P.M.               DATE OF BIRTH:  [birth year]   DATE OF PROCEDURE:  12/20/2002  DATE OF DISCHARGE:  12/20/2002                                 OPERATIVE REPORT   PREOPERATIVE DIAGNOSES:  1. Soft tissue mass dorsolateral aspect of right foot.  2. Hallux abductovalgus deformity right foot.   POSTOPERATIVE DIAGNOSES:  1. Ganglionic cyst from fourth metatarsal cuboid joint right foot.  2. Hallux abductovalgus deformity right foot.   PROCEDURE:  1. Austin bunionectomy right foot.  2. Excision of ganglion dorsolateral midfoot right foot.   SURGEON:  Denny Peon. Ulice Brilliant, DPM   ANESTHESIA:  MAC   INDICATIONS FOR SURGERY:  Progressively enlarging mass on the dorsolateral  aspect of the right foot and painful hallux abductovalgus deformity right  foot. The patient has had previous problems with gouty arthritis.  He  relates that he episodically will get pain, increased, in the first MTP; but  has chronically had pain in this area for over the last year.   DESCRIPTION OF PROCEDURE:  [name] is brought into the OR and placed  on the table in the supine position.  IV sedation is established.  A block  is performed in a V-fashion, proximal to the ganglion on the dorsolateral  aspect of the foot as well as around the first MTP this using a 1:1 mixture  of lidocaine and Marcaine.  A pneumatic ankle tourniquet was then applied  across his right ankle.  His foot is prepped and draped in the usual aseptic  fashion.  An ACE bandage is utilized to exsanguinate his foot.  The  tourniquet is inflated to 250 mmHg.   Procedure #1: EXCISION OF GANGLION DORSOLATERAL ASPECT OF RIGHT FOOT.  Attention is directed to this enlarged area over this extensor digitorum  muscle belly.  A curvilinear skin incision approximately 5 cm in length is  made.  The incision is deepened through subcutaneous tissue with care taken  to avoid neural and vascular structures.  The extensor digitorum brevis  muscle belly is identified.  Its boundaries are traced utilizing a spreading  motion with curved hemostats.  The ganglion is visualized after undermining  on the superior medial aspect of the muscle.  The ganglion is visualized  deep to the muscle.  The muscle is then retracted laterally and the ganglion  is exposed.  Unfortunately the ganglion burst prior to complete excision,  however, the remaining sac of this is removed completely following  evacuation of the contents with a good suction device.  The deep fascia is  then closed over the fourth metatarsal cuboid joint utilizing 3-0 Vicryl in  a running horizontal mattress suture.  Subcutaneous tissues have  been  reapproximated over the extensor digitorum brevis muscle belly with 4-0  Vicryl in a running horizontal mattress suture.  Skin is then closed with a  running subcuticular suture of 4-0 Vicryl.   Procedure #2: AUSTIN BUNIONECTOMY RIGHT FOOT.  Attention is directed to the  first MTP of this right foot.  A 7 cm curvilinear skin incision is made.  The incision is deepened both by a sharp and blunt dissection with care  taken to avoid neural and vascular structures.  A dorsal linear capsulotomy  is then performed. The capsular periosteal tissues are reflected away from  the medial eminence of the first metatarsal head.  The medial eminence is  resected.  Attention was then directed into the first web space due to the  original skin incision.  A Weitlaner self-retaining retractor is utilized to  aid in exposure.  The dissection was carried down to the level of the  adductor hallucis tendon.  This was then isolated and severed.   Attention was then directed back to the first metatarsal medially.  A 0.045  K-wire  was introduced and utilized to act as a pin access guide.  The  osteotomy cuts are then made with a #63 blade on the oscillating and a  sagittal saw.  With the osteotomy completed, the capital fragment is  relocated to approximately 25-30% across the width of the first metatarsal  surface.  Care was taken during the planning of the osteotomy with the pin  access guide that there would not be excessive shortening of the metatarsal.  With the capital fragment relocated, approximately 25-30% across the width  of the surface the osteotomy is then fixated utilizing 0.45 K-wires in a  divergent fashion.  Care is taken that there is no redundant protrusion of  the K-wires through the articular cartilage.   The toe is put through a range of motion and found to be freely movable.  The positioning of the K-wires is identified utilizing the fluoroscopy unit.  The K-wires are then bent close to the metatarsal surface, cut, and rotated  so that they lay flush.  The wound is flushed with copious amounts of  irrigant.  Redundant bone medially is excised.  The newly formed osseous  surface is then rasped smooth.  Capsular tissues are then reapproximated and  closed with 3-0 Vicryl.  Subcutaneous tissues are reapproximated and closed  with 4-0 Vicryl.  The skin is closed with a subcuticular suture of 4-0  Vicryl.   Steri-Strips were applied across both incisions.  A postoperative injection  of Marcaine and Hexadrol was dispensed.  A Betadine soaked Adaptic dressing  was applied across the incisions; and a dry sterile compressive dressing  follows.  The tourniquet is deflated with tourniquet time spanning 72  minutes.   [name] tolerates the anesthesia and procedure well.  He is  transported to [facility] without incident.  While there, a list of  written instructions are orally explained to his wife.  A prescription for Lortab 10/650 and Ambien 5 mg is dispensed. He will be seen within 7 days   for first postoperative visit.                                               Denny Peon. Ulice Brilliant, D.P.M.    CMD/MEDQ  D:  12/25/2002  T:  12/25/2002  Job:  [id number]

## 2010-09-26 NOTE — H&P (Signed)
   [name], [name]                    ACCOUNT NO.:  [id number]   MEDICAL RECORD NO.:  [id number]                   PATIENT TYPE:  AMB   LOCATION:  DAY                                  FACILITY:  [facility]   PHYSICIAN:  Cody M. Ulice Brilliant, D.P.M.               DATE OF BIRTH:  1960-07-16   DATE OF ADMISSION:  12/20/2002  DATE OF DISCHARGE:                                HISTORY & PHYSICAL   CHIEF COMPLAINT:  Painful bunion deformity on the right foot, as well as a  large mass on the dorsolateral aspect of the right foot.   HISTORY OF PRESENT ILLNESS:  [name] relates that the bunion has bothered him  for about a year.  It does seem to be worse when he gets gouty arthritis in  this, but it chronically does stay sore.  Secondly, he has a soft tissue  mass on the lateral aspect of the right foot which had been present for  several months.  He relates that does not give him significant pain, but it  does cause problems with shoe gear.   PAST MEDICAL HISTORY:  [name]'s past medical history is significant for  hypertension.   MEDICATIONS:  He is on Lotrel and another fluid medication.   ALLERGIES:  He has no known drug allergies.   SOCIAL HISTORY:  He does drink alcohol.  He is a nonsmoker.   OBJECTIVE:  He is noted to have a mild to moderate bunion deformity of the  right foot with a pronounced medial eminence of the great toe.  He is  starting to abut the second toe.  There are no erosive changes noted at the  HAV or the medial eminence.  Radiographically there is a soft tissue shadow  noted over the AP view on the dorsolateral aspect of the foot.  However,  there are no bony changes seen here.   ASSESSMENT:  1. Hallux abductovalgus deformity of the right foot.  2. Ganglionic cyst (I believe) on the dorsolateral aspect of the right foot.   PLAN:  This will consist of a surgical correction of an Austin bunionectomy  of the right foot, as well as excision of this soft tissue mass,  likely  ganglionic cyst.  This will be done under monitored anesthesia care at [facility].  [name] was presented on December 06, 2002, for consent form  signing.  We discussed the procedure and the postoperative course.  He has  read the consent, apparently understood, and signed.                                               Denny Peon. Ulice Brilliant, D.P.M.    CMD/MEDQ  D:  12/19/2002  T:  12/19/2002  Job:  [id number]

## 2012-03-19 IMAGING — CR DG KNEE 1-2V PORT*L*
4 series · 4 of 4 positions shown · non-contrast
Comparison: Left knee radiographs 12/15/2005.

CLINICAL DATA: Left knee pain.

PORTABLE LEFT KNEE - 1-2 VIEW

[view not recorded (1 of 4)]
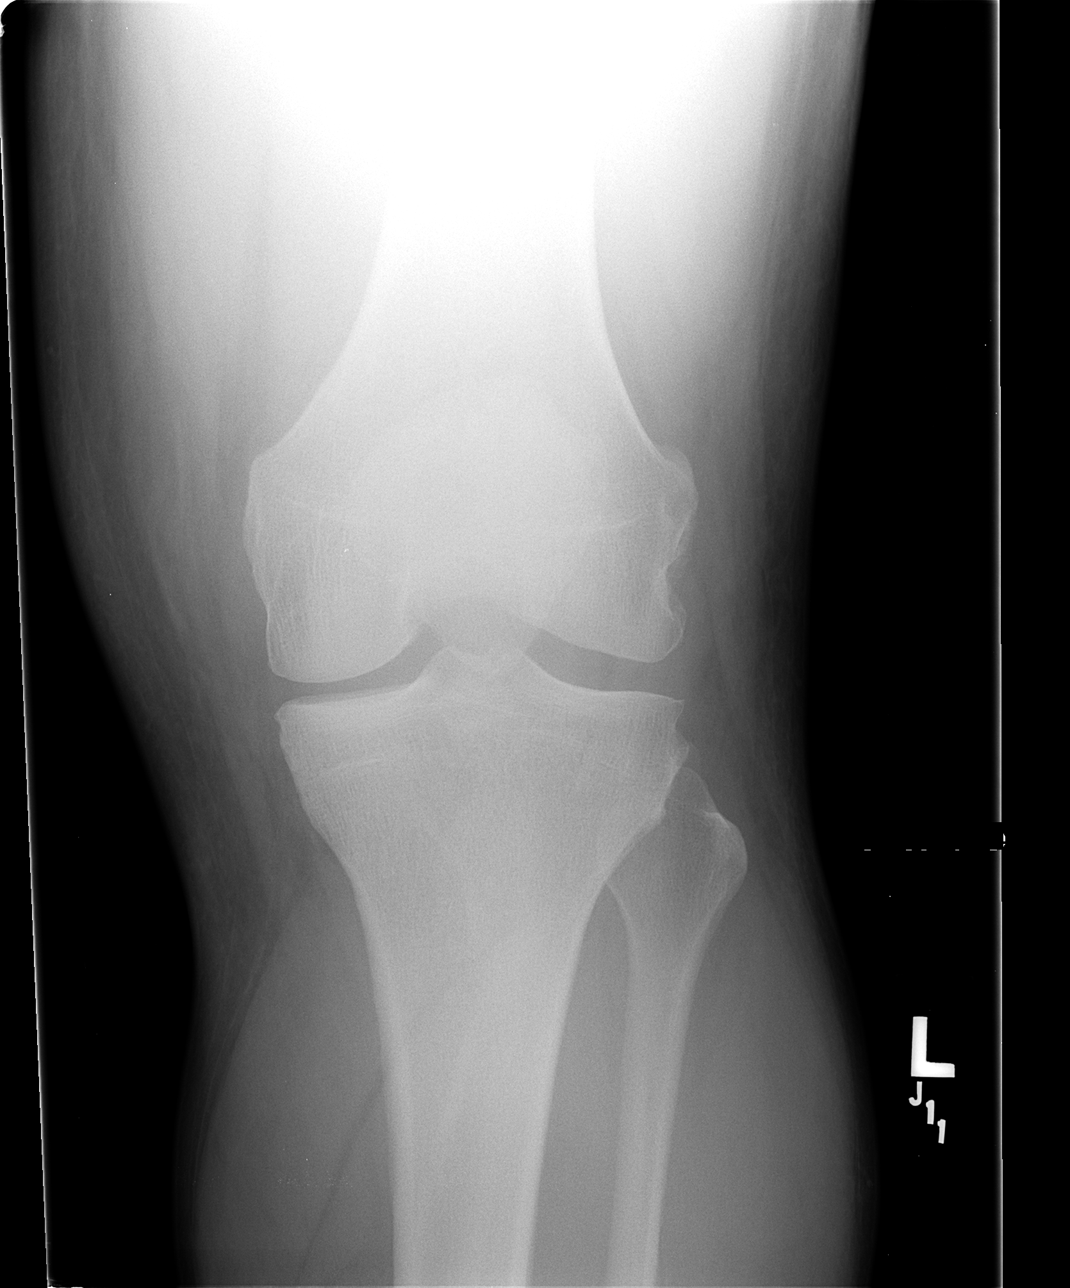

[view not recorded (2 of 4)]
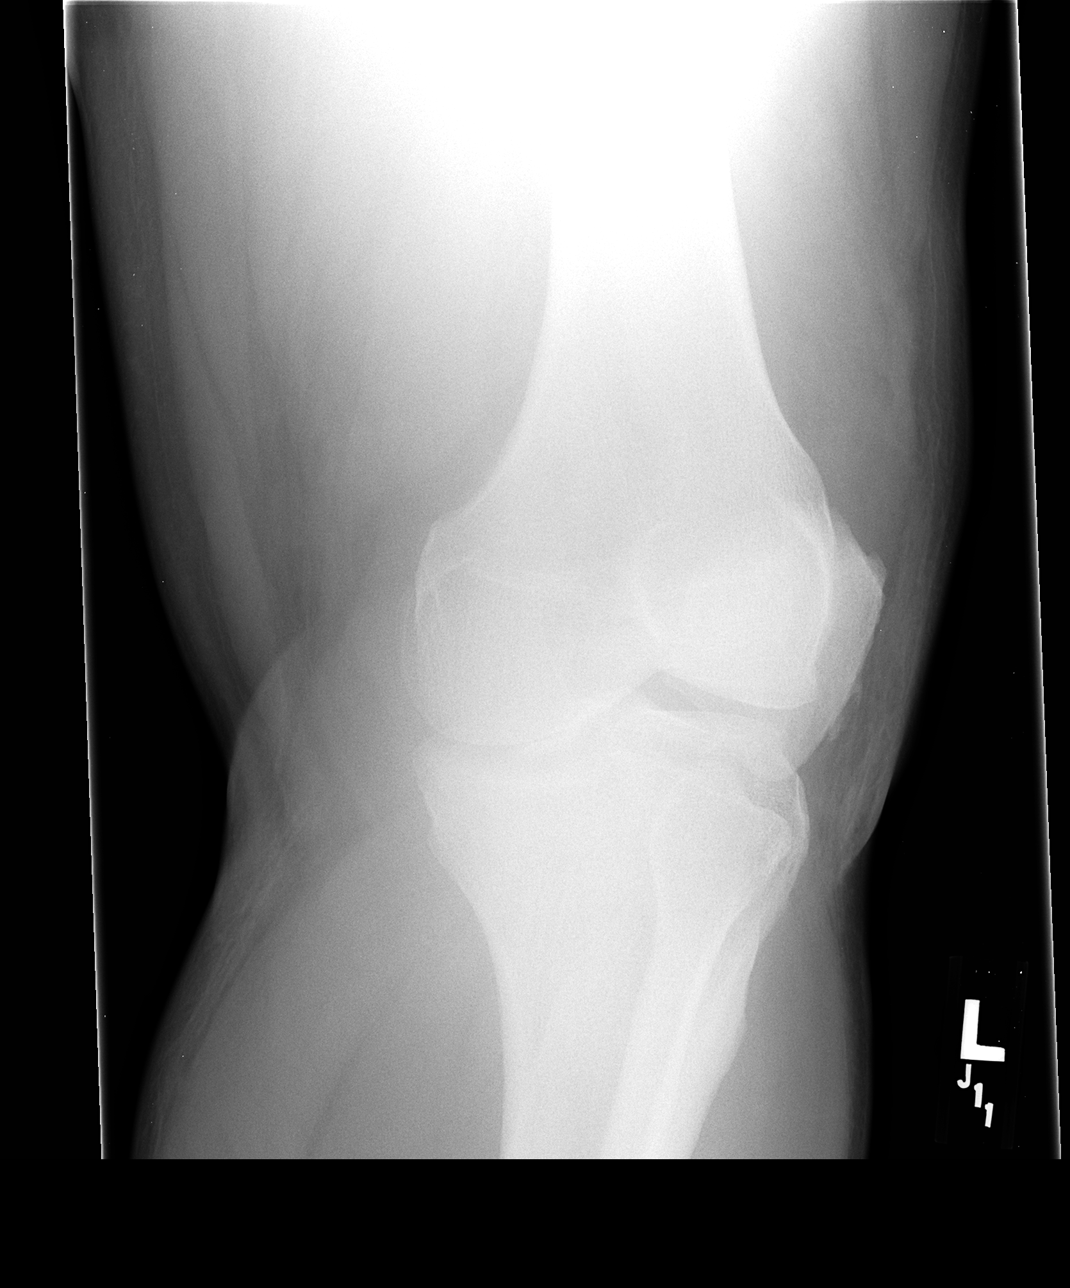

[view not recorded (3 of 4)]
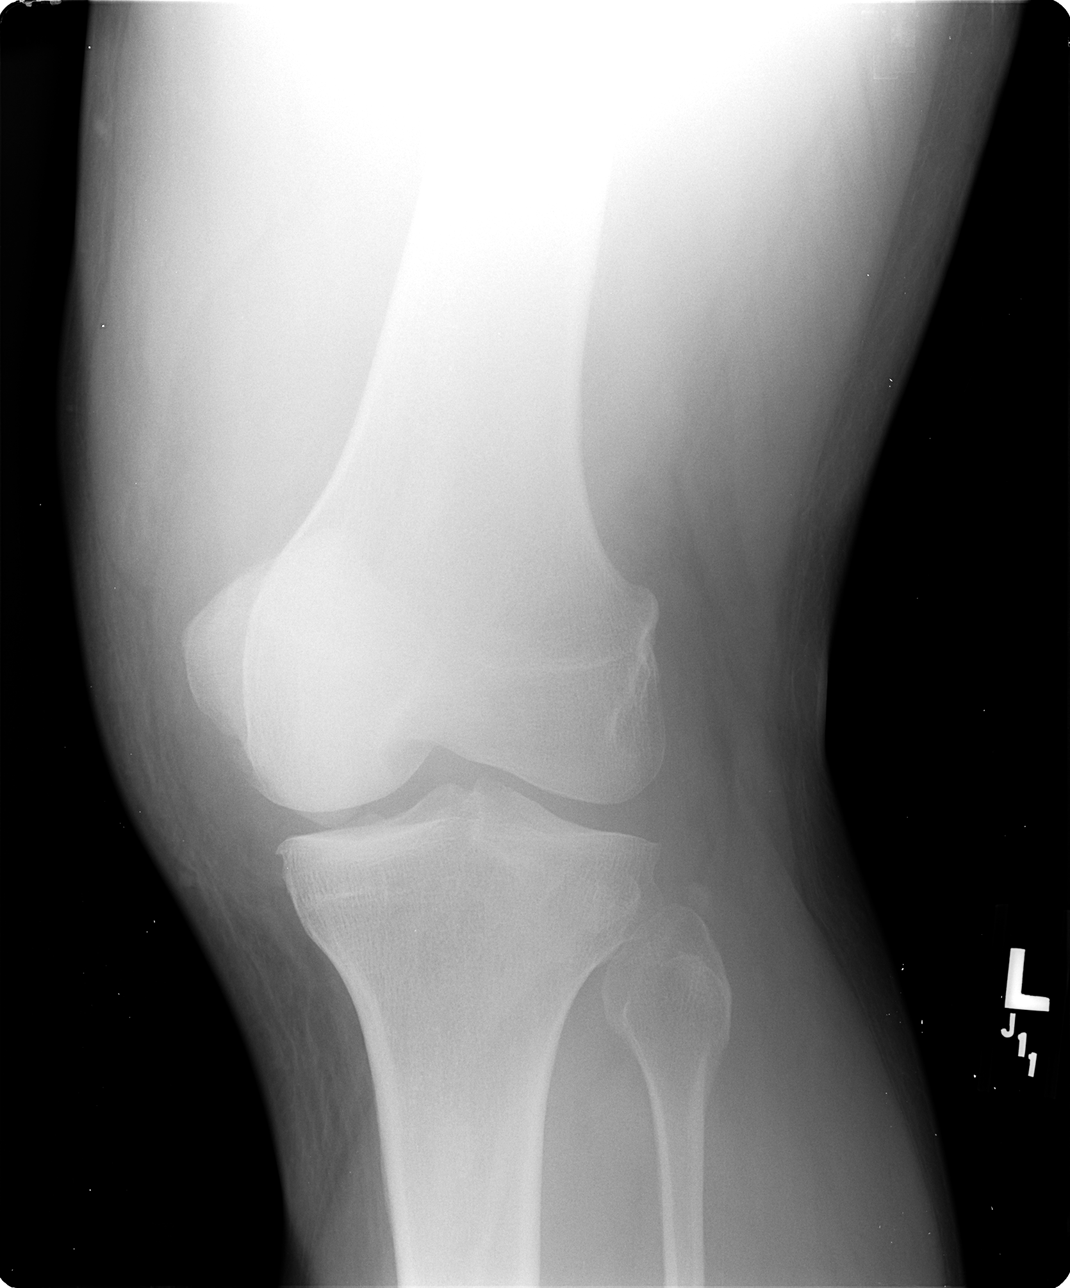

[view not recorded (4 of 4)]
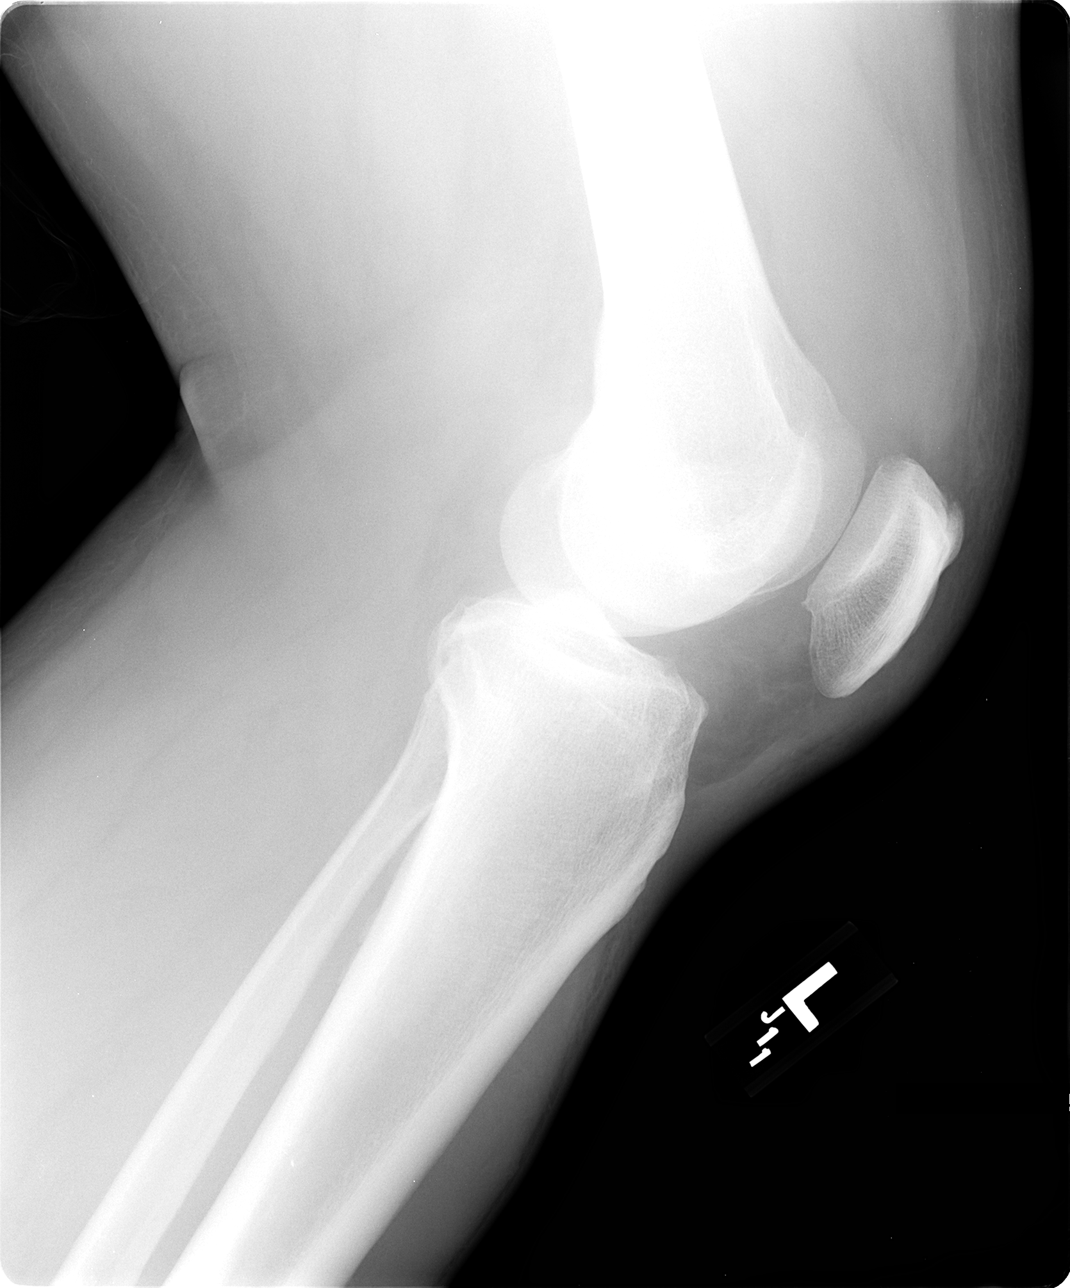

[4 of 4 positions shown; findings below may reference images not displayed]

FINDINGS: The joint spaces are fairly well maintained.  Minimal
degenerative changes.  No acute bony findings or osteochondral
lesions.  There is a large joint effusion.
IMPRESSION: 1.  Mild degenerative changes but no acute bony findings.
2.  Large joint effusion.

## 2013-12-04 ENCOUNTER — Emergency Department (HOSPITAL_COMMUNITY): Payer: Medicare HMO

## 2013-12-04 ENCOUNTER — Encounter (HOSPITAL_COMMUNITY): Payer: Self-pay | Admitting: Emergency Medicine

## 2013-12-04 ENCOUNTER — Emergency Department (HOSPITAL_COMMUNITY)
Admission: EM | Admit: 2013-12-04 | Discharge: 2013-12-04 | Disposition: A | Discharge status: Home / Self Care | Payer: Medicare HMO | Attending: Emergency Medicine | Admitting: Emergency Medicine

## 2013-12-04 DIAGNOSIS — S99929A Unspecified injury of unspecified foot, initial encounter: Secondary | ICD-10-CM | POA: Diagnosis not present

## 2013-12-04 DIAGNOSIS — S99919A Unspecified injury of unspecified ankle, initial encounter: Secondary | ICD-10-CM | POA: Diagnosis present

## 2013-12-04 DIAGNOSIS — R52 Pain, unspecified: Secondary | ICD-10-CM | POA: Diagnosis not present

## 2013-12-04 DIAGNOSIS — Y929 Unspecified place or not applicable: Secondary | ICD-10-CM | POA: Insufficient documentation

## 2013-12-04 DIAGNOSIS — S8990XA Unspecified injury of unspecified lower leg, initial encounter: Secondary | ICD-10-CM | POA: Insufficient documentation

## 2013-12-04 DIAGNOSIS — Y9389 Activity, other specified: Secondary | ICD-10-CM | POA: Insufficient documentation

## 2013-12-04 DIAGNOSIS — M25562 Pain in left knee: Secondary | ICD-10-CM

## 2013-12-04 DIAGNOSIS — X500XXA Overexertion from strenuous movement or load, initial encounter: Secondary | ICD-10-CM | POA: Diagnosis not present

## 2013-12-04 MED ORDER — OXYCODONE-ACETAMINOPHEN 5-325 MG PO TABS: 1.0000 | ORAL_TABLET | ORAL | Status: DC | PRN

## 2013-12-04 NOTE — Discharge Instructions (Signed)

## 2013-12-04 NOTE — ED Notes (Signed)
Pt states he twisted his knee this am and it has swollen since then.

## 2013-12-04 NOTE — ED Provider Notes (Signed)
CSN: [id number]     Arrival date & time 12/04/13  1541 History   First MD Initiated Contact with Patient 12/04/13 1618    This chart was scribed for non-physician practitioner Kem Parkinson, PA-C working with Ezequiel Essex, MD, by Thea Alken, ED Scribe. This patient was seen in room APFT24/APFT24 and the patient's care was started at 4:31 PM. Chief Complaint  Patient presents with  . Knee Pain   The history is provided by the patient. No language interpreter was used.   [name] is a 53 y.o. male who presents to the Emergency Department complaining of sudden left knee pain onset this mornig. Pt reports he twisted his knee this morning getting on the back of a truck. Pt reports he heard a "pop" in his knee. He reports associated lateral calf pain . He reports pain with bearing weight and extending knee. Pt reports prior pain to lower leg 3 days ago. Pt denies applying ice to leg or knee. Pt denies numbness tingling and weakness, fever, chills or redness. 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   History reviewed. No pertinent past medical history. History reviewed. No pertinent past surgical history. History reviewed. No pertinent family history. History  Substance Use Topics  . Smoking status: Not on file  . Smokeless tobacco: Not on file  . Alcohol Use: Not on file    Review of Systems  Constitutional: Negative for fever and chills.  Genitourinary: Negative for dysuria and difficulty urinating.  Musculoskeletal: Positive for arthralgias and myalgias. Negative for back pain, gait problem and joint swelling.  Skin: Negative for color change and wound.  Neurological: Negative for weakness and numbness.  All other systems reviewed and are negative.  Allergies  Review of patient's allergies indicates not on file.  Home Medications   Prior to Admission medications   Not on File   BP 142/90  Pulse 68  Temp(Src) 98.6 F (37 C) (Oral)  Resp 20  Ht 6'  (1.829 m)  Wt 300 lb (136.079 kg)  BMI 40.68 kg/m2  SpO2 98% Physical Exam  Nursing note and vitals reviewed. Constitutional: He is oriented to person, place, and time. He appears well-developed and well-nourished. No distress.  HENT:  Head: Normocephalic and atraumatic.  Eyes: Conjunctivae and EOM are normal.  Neck: Neck supple.  Cardiovascular: Normal rate, regular rhythm, normal heart sounds and intact distal pulses.  Exam reveals no gallop.   Pulmonary/Chest: Effort normal and breath sounds normal.  Musculoskeletal: Normal range of motion. He exhibits tenderness.       Left upper leg: He exhibits tenderness. He exhibits no bony tenderness, no swelling, no edema, no deformity and no laceration.       Legs: Localized tenderness along the lateral aspect the left lateral knee and lateral posterior leg. Pt has full ROM of the knee. Negative Thompson's test , Achilles tendon intact.  No obvious erythema, warmth or edema.   Neurological: He is alert and oriented to person, place, and time. He exhibits normal muscle tone. Coordination normal.  Skin: Skin is warm and dry. No erythema.  Psychiatric: He has a normal mood and affect. His behavior is normal.    ED Course  Procedures (including critical care time) DIAGNOSTIC STUDIES: Oxygen Saturation is 98% on RA, normal by my interpretation.    COORDINATION OF CARE: 5:27 PM- X-rays results were discussed with pt.  Pt advised of plan for treatment which includes knee immobilizer and pain medication  and pt agrees. Pt will f/u with orthopedist, Dr. Aline Brochure .  Labs Review Labs Reviewed - No data to display  Imaging Review Dg Knee Complete 4 Views Left  12/04/2013   CLINICAL DATA:  Left posterior knee pain and swelling. The patient stated that he twisted his knee and heard a pop.  EXAM: LEFT KNEE - COMPLETE 4+ VIEW  COMPARISON:  Left knee radiographs 09/03/2010  FINDINGS: The left knee is located. Mild degenerative changes are again seen. A  moderate joint effusion is evident. No acute or focal osseous abnormality is present.  IMPRESSION: 1. Moderate joint effusion without acute osseous abnormality. Internal derangement is not excluded. 2. Similar appearance of mild degenerative changes.   Electronically Signed   By: Lawrence Santiago M.D.   On: 12/04/2013 16:58    EKG Interpretation None      MDM   Final diagnoses:  Knee pain, acute, left   Pt ambulates and bears weight to the left leg.  No concerning sx;s for DVT or Achilles tendon rupture.  Likely sprain.  Agrees to knee immobilizer, elevate ice and close orthopedic f/u in 1 week if not improving.  Rx for percocet for pain  I personally performed the services described in this documentation, which was scribed in my presence. The recorded information has been reviewed and is accurate.      Chanan Detwiler L. Sariya Trickey, PA-C 12/06/13 1708

## 2013-12-06 NOTE — ED Provider Notes (Signed)
Medical screening examination/treatment/procedure(s) were performed by non-physician practitioner and as supervising physician I was immediately available for consultation/collaboration.   EKG Interpretation None        Ezequiel Essex, MD 12/06/13 1910

## 2014-03-25 ENCOUNTER — Encounter (HOSPITAL_COMMUNITY): Payer: Self-pay | Admitting: Cardiology

## 2014-03-25 ENCOUNTER — Emergency Department (HOSPITAL_COMMUNITY)
Admission: EM | Admit: 2014-03-25 | Discharge: 2014-03-25 | Disposition: A | Discharge status: Home / Self Care | Payer: Medicare HMO | Attending: Emergency Medicine | Admitting: Emergency Medicine

## 2014-03-25 ENCOUNTER — Emergency Department (HOSPITAL_COMMUNITY): Payer: Medicare HMO

## 2014-03-25 DIAGNOSIS — M10062 Idiopathic gout, left knee: Secondary | ICD-10-CM | POA: Insufficient documentation

## 2014-03-25 DIAGNOSIS — Z79899 Other long term (current) drug therapy: Secondary | ICD-10-CM | POA: Insufficient documentation

## 2014-03-25 DIAGNOSIS — M79605 Pain in left leg: Secondary | ICD-10-CM | POA: Diagnosis present

## 2014-03-25 DIAGNOSIS — R52 Pain, unspecified: Secondary | ICD-10-CM

## 2014-03-25 MED ORDER — OXYCODONE-ACETAMINOPHEN 5-325 MG PO TABS
ORAL_TABLET | ORAL | Status: AC
  Administered 2014-03-25: 1 via ORAL
  Filled 2014-03-25: qty 1

## 2014-03-25 MED ORDER — OXYCODONE-ACETAMINOPHEN 5-325 MG PO TABS: 1.0000 | ORAL_TABLET | Freq: Four times a day (QID) | ORAL | Status: DC | PRN

## 2014-03-25 MED ORDER — LIDOCAINE HCL (PF) 1 % IJ SOLN
INTRAMUSCULAR | Status: AC
  Filled 2014-03-25: qty 5

## 2014-03-25 MED ORDER — OXYCODONE-ACETAMINOPHEN 5-325 MG PO TABS
1.0000 | ORAL_TABLET | Freq: Once | ORAL | Status: AC
  Administered 2014-03-25: 1 via ORAL

## 2014-03-25 MED ORDER — PREDNISONE 10 MG PO TABS: 20.0000 mg | ORAL_TABLET | Freq: Every day | ORAL | Status: DC

## 2014-03-25 MED ORDER — LIDOCAINE HCL (PF) 1 % IJ SOLN: 5.0000 mL | Freq: Once | INTRAMUSCULAR | Status: DC

## 2014-03-25 NOTE — Discharge Instructions (Signed)
Follow up with dr. Aline Brochure this week.  Or follow up with your md

## 2014-03-25 NOTE — ED Provider Notes (Signed)
CSN: [id number]     Arrival date & time 03/25/14  0920 History  This chart was scribed for Andrew Diego, MD by Tula Nakayama, ED Scribe. This patient was seen in room APA08/APA08 and the patient's care was started at 9:44 AM.    Chief Complaint  Patient presents with  . Leg Pain   Patient is a 53 y.o. male presenting with leg pain. The history is provided by the patient. No language interpreter was used.  Leg Pain Location:  Knee Time since incident:  1 day Injury: no   Knee location:  L knee Pain details:    Quality:  Unable to specify   Radiates to:  Does not radiate   Severity:  Moderate   Onset quality:  Gradual   Duration:  1 day   Timing:  Constant   Progression:  Unchanged Chronicity:  Recurrent Dislocation: no   Foreign body present:  No foreign bodies Associated symptoms: swelling   Associated symptoms: no back pain and no fatigue    HPI Comments: BRANTLEY WILEY is a 53 y.o. male who presents to the Emergency Department complaining of constant left, lateral and posterior knee pain and swelling that started 1 day ago. Pt stated history of similar symptoms that occurred last summer when he was diagnosed with gout. He takes Colchicine daily for symptoms.  PCP is [facility] [place] in [place]  History reviewed. No pertinent past medical history. History reviewed. No pertinent past surgical history. History reviewed. No pertinent family history. History  Substance Use Topics  . Smoking status: Never Smoker   . Smokeless tobacco: Current User    Types: Chew  . Alcohol Use: 1.8 oz/week    3 Cans of beer per week     Comment: 2-3 beer dailys    Review of Systems  Constitutional: Negative for appetite change and fatigue.  HENT: Negative for congestion, ear discharge and sinus pressure.   Eyes: Negative for discharge.  Respiratory: Negative for cough.   Cardiovascular: Negative for chest pain.  Gastrointestinal: Negative for abdominal pain and diarrhea.   Genitourinary: Negative for frequency and hematuria.  Musculoskeletal: Positive for joint swelling and arthralgias. Negative for back pain.  Skin: Negative for rash.  Neurological: Negative for seizures and headaches.  Psychiatric/Behavioral: Negative for hallucinations.   Allergies  Review of patient's allergies indicates no known allergies.  Home Medications   Prior to Admission medications   Medication Sig Start Date End Date Taking? Authorizing Provider  amLODipine (NORVASC) 10 MG tablet Take 10 mg by mouth daily. 11/24/13   Historical Provider, MD  carvedilol (COREG) 25 MG tablet Take 25 mg by mouth 2 (two) times daily. 11/24/13   Historical Provider, MD  cloNIDine (CATAPRES) 0.1 MG tablet Take 1 tablet by mouth 2 (two) times daily. 11/24/13   Historical Provider, MD  COLCRYS 0.6 MG tablet Take 1 tablet by mouth daily. 11/24/13   Historical Provider, MD  hydrALAZINE (APRESOLINE) 100 MG tablet Take 100 mg by mouth 3 (three) times daily. 10/25/13   Historical Provider, MD  losartan (COZAAR) 100 MG tablet Take 100 mg by mouth daily. 11/24/13   Historical Provider, MD  metFORMIN (GLUCOPHAGE) 1000 MG tablet Take 1,000 mg by mouth 2 (two) times daily. 11/24/13   Historical Provider, MD  metoprolol (LOPRESSOR) 50 MG tablet Take 50 mg by mouth 2 (two) times daily. 11/24/13   Historical Provider, MD  oxyCODONE-acetaminophen (PERCOCET/ROXICET) 5-325 MG per tablet Take 1 tablet by mouth every 4 (four) hours  as needed. 12/04/13   Tammy L. Triplett, PA-C  pravastatin (PRAVACHOL) 40 MG tablet Take 40 mg by mouth daily. 11/24/13   Historical Provider, MD  spironolactone (ALDACTONE) 25 MG tablet Take 25 mg by mouth daily. 11/24/13   Historical Provider, MD   BP 160/102 mmHg  Pulse 73  Temp(Src) 99.4 F (37.4 C) (Oral)  Resp 18  Ht 5\' 8"  (1.727 m)  Wt 310 lb (140.615 kg)  BMI 47.15 kg/m2  SpO2 96%   Physical Exam  Constitutional: He is oriented to person, place, and time. He appears well-developed.   HENT:  Head: Normocephalic.  Eyes: Conjunctivae are normal.  Neck: No tracheal deviation present.  Cardiovascular:  No murmur heard. Musculoskeletal: Normal range of motion. He exhibits edema and tenderness.  Significant swelling of left knee with mild to moderate tenderness all around knee  Neurological: He is oriented to person, place, and time.  Skin: Skin is warm.  Psychiatric: He has a normal mood and affect.  Nursing note and vitals reviewed.  ED Course  Procedures (including critical care time) DIAGNOSTIC STUDIES: Oxygen Saturation is 96% on RA, adequate by my interpretation.    COORDINATION OF CARE: 9:50 AM Discussed treatment plan with pt which includes prednisone and drainage and pt agreed to plan.  Labs Review Labs Reviewed - No data to display  Imaging Review No results found.   EKG Interpretation None     Pt had hil left knee tapped.   30 cc of fluid removed.   Lido no epi to numb skin MDM   Final diagnoses:  None    Gout,  Knee effusion,    Follow up with dr Zigmund Gottron  The chart was scribed for me under my direct supervision.  I personally performed the history, physical, and medical decision making and all procedures in the evaluation of this patient.Andrew Diego, MD 03/25/14 [phone]

## 2014-03-25 NOTE — ED Notes (Signed)
Left knee pain and swelling since Friday .  Denies any injury

## 2014-03-25 NOTE — ED Notes (Signed)
Supplies at bedside per MD request: Suture cart, lidocaine, appropriate needle, and syringe at bedside.

## 2014-03-25 NOTE — ED Notes (Signed)
Family member out to desk, stating patient requesting pain medication. MD aware. Verbal orders obtained.

## 2014-05-01 ENCOUNTER — Ambulatory Visit (INDEPENDENT_AMBULATORY_CARE_PROVIDER_SITE_OTHER): Payer: Medicare HMO | Admitting: Orthopedic Surgery

## 2014-05-01 VITALS — BP 134/81 | Ht 68.0 in | Wt 310.0 lb

## 2014-05-01 DIAGNOSIS — M25462 Effusion, left knee: Secondary | ICD-10-CM

## 2014-05-01 DIAGNOSIS — M1A0620 Idiopathic chronic gout, left knee, without tophus (tophi): Secondary | ICD-10-CM

## 2014-05-01 NOTE — Patient Instructions (Signed)
Take colcrys four times a day when you have gout attack

## 2014-05-01 NOTE — Progress Notes (Signed)
Patient ID: [name], male   DOB: [birth year], 53 y.o.   MRN: [id number]  Chief Complaint  Patient presents with  . Knee Pain    Left knee pain, no injury. Referred by Nicki Reaper  from [facility].    HPI [name] is a 53 y.o. male.  Sent with a chronic history of gout patient has been on: Gerald Stabs and allopurinol recently for ongoing frequent joint effusions and gout attacks in his left knee. He sent for evaluation and treatment modifications if needed.  Complains of pain swelling throbbing aching 10 out of 10 pain when he has a gout attack though not having one today. He reports history of previous injection .  HPI  Review of Systems Review of Systems  Positive review of systems dental problems breathing issues ankle x-ray swelling joint pain swollen joints back pain G UGI normal  Family history diabetes COPD hypertension heart attack cancer  Social history does not smoke but chews tobacco drinks is currently not working disabled has no allergies  Medical history diabetes hypertension list no previous surgeries. Medications recorded. No past medical history on file.  No past surgical history on file.  No family history on file.  Social History History  Substance Use Topics  . Smoking status: Never Smoker   . Smokeless tobacco: Current User    Types: Chew  . Alcohol Use: 1.8 oz/week    3 Cans of beer per week     Comment: 2-3 beer dailys    No Known Allergies  Current Outpatient Prescriptions  Medication Sig Dispense Refill  . amLODipine (NORVASC) 10 MG tablet Take 10 mg by mouth daily.    Marland Kitchen aspirin EC 81 MG tablet Take 81 mg by mouth daily.    . cloNIDine (CATAPRES) 0.1 MG tablet Take 1 tablet by mouth 2 (two) times daily.    Marland Kitchen COLCRYS 0.6 MG tablet Take 1 tablet by mouth daily.    . hydrALAZINE (APRESOLINE) 100 MG tablet Take 100 mg by mouth 3 (three) times daily.    Marland Kitchen losartan (COZAAR) 100 MG tablet Take 100 mg by mouth daily.    .  metFORMIN (GLUCOPHAGE) 1000 MG tablet Take 1,000 mg by mouth 2 (two) times daily.    . metoprolol (LOPRESSOR) 50 MG tablet Take 50 mg by mouth 2 (two) times daily.    Marland Kitchen oxyCODONE-acetaminophen (PERCOCET/ROXICET) 5-325 MG per tablet Take 1 tablet by mouth every 6 (six) hours as needed. 30 tablet 0  . pravastatin (PRAVACHOL) 40 MG tablet Take 40 mg by mouth daily.    . predniSONE (DELTASONE) 10 MG tablet Take 2 tablets (20 mg total) by mouth daily. 15 tablet 0  . spironolactone (ALDACTONE) 25 MG tablet Take 25 mg by mouth daily.     No current facility-administered medications for this visit.       Physical Exam Blood pressure 134/81, height 5\' 8"  (1.727 m), weight 310 lb (140.615 kg). Physical Exam BP 134/81 mmHg  Ht 5\' 8"  (1.727 m)  Wt 310 lb (140.615 kg)  BMI 47.15 kg/m2 Normal development grooming hygiene oriented 3 mood and affect normal gait unsupported without a limp  Palpable effusion in the left knee knee flexion 115 knee stable motor exam normal skin intact pulses normal sensation normal lymph nodes negative  Data Reviewed Independent x-ray interpretation degenerative arthritis with effusion  Report read as follows  IMPRESSION: 1. Slight interval enlargement of previously identified suprapatellar knee joint effusion. Differential considerations include degenerative, inflammatory, and  infectious etiologies. Hemarthrosis is considered less likely. 2. No acute fracture or malalignment. 3. No significant degenerative change.   Assessment    Encounter Diagnoses  Name Primary?  . Effusion of knee joint, left Yes  . Idiopathic chronic gout of left knee without tophus         Plan    Aspiration injection left knee  Continue allopurinol daily Take Colcrys every 6 when he has a gout attack

## 2014-08-29 DIAGNOSIS — I10 Essential (primary) hypertension: Secondary | ICD-10-CM | POA: Insufficient documentation

## 2017-08-31 ENCOUNTER — Other Ambulatory Visit: Payer: Self-pay

## 2017-11-15 ENCOUNTER — Encounter: Payer: Self-pay | Admitting: Nurse Practitioner

## 2017-11-23 ENCOUNTER — Other Ambulatory Visit: Payer: Self-pay

## 2017-11-23 DIAGNOSIS — Z1211 Encounter for screening for malignant neoplasm of colon: Secondary | ICD-10-CM

## 2017-11-24 ENCOUNTER — Other Ambulatory Visit: Payer: Self-pay

## 2017-12-06 ENCOUNTER — Ambulatory Visit: Payer: Medicare HMO | Admitting: Registered Nurse

## 2017-12-06 ENCOUNTER — Encounter
Admission: RE | Disposition: A | Discharge status: Home / Self Care | Payer: Self-pay | Source: Ambulatory Visit | Attending: Gastroenterology

## 2017-12-06 ENCOUNTER — Encounter: Payer: Self-pay | Admitting: *Deleted

## 2017-12-06 ENCOUNTER — Other Ambulatory Visit: Payer: Self-pay

## 2017-12-06 ENCOUNTER — Ambulatory Visit
Admission: RE | Admit: 2017-12-06 | Discharge: 2017-12-06 | Disposition: A | Discharge status: Home / Self Care | Payer: Medicare HMO | Source: Ambulatory Visit | Attending: Gastroenterology | Admitting: Gastroenterology

## 2017-12-06 DIAGNOSIS — M109 Gout, unspecified: Secondary | ICD-10-CM | POA: Insufficient documentation

## 2017-12-06 DIAGNOSIS — K219 Gastro-esophageal reflux disease without esophagitis: Secondary | ICD-10-CM | POA: Diagnosis not present

## 2017-12-06 DIAGNOSIS — K635 Polyp of colon: Secondary | ICD-10-CM | POA: Diagnosis not present

## 2017-12-06 DIAGNOSIS — G4730 Sleep apnea, unspecified: Secondary | ICD-10-CM | POA: Diagnosis not present

## 2017-12-06 DIAGNOSIS — K640 First degree hemorrhoids: Secondary | ICD-10-CM | POA: Insufficient documentation

## 2017-12-06 DIAGNOSIS — D122 Benign neoplasm of ascending colon: Secondary | ICD-10-CM

## 2017-12-06 DIAGNOSIS — Z1211 Encounter for screening for malignant neoplasm of colon: Secondary | ICD-10-CM | POA: Diagnosis not present

## 2017-12-06 DIAGNOSIS — I10 Essential (primary) hypertension: Secondary | ICD-10-CM | POA: Insufficient documentation

## 2017-12-06 DIAGNOSIS — E119 Type 2 diabetes mellitus without complications: Secondary | ICD-10-CM | POA: Diagnosis not present

## 2017-12-06 DIAGNOSIS — Z7982 Long term (current) use of aspirin: Secondary | ICD-10-CM | POA: Insufficient documentation

## 2017-12-06 DIAGNOSIS — Z79899 Other long term (current) drug therapy: Secondary | ICD-10-CM | POA: Diagnosis not present

## 2017-12-06 DIAGNOSIS — I2510 Atherosclerotic heart disease of native coronary artery without angina pectoris: Secondary | ICD-10-CM | POA: Diagnosis not present

## 2017-12-06 DIAGNOSIS — Z7984 Long term (current) use of oral hypoglycemic drugs: Secondary | ICD-10-CM | POA: Insufficient documentation

## 2017-12-06 HISTORY — DX: Gout, unspecified: M10.9

## 2017-12-06 HISTORY — DX: Sleep apnea, unspecified: G47.30

## 2017-12-06 HISTORY — DX: Gastro-esophageal reflux disease without esophagitis: K21.9

## 2017-12-06 HISTORY — DX: Cardiac arrhythmia, unspecified: I49.9

## 2017-12-06 HISTORY — DX: Essential (primary) hypertension: I10

## 2017-12-06 HISTORY — DX: Type 2 diabetes mellitus without complications: E11.9

## 2017-12-06 HISTORY — DX: Atherosclerotic heart disease of native coronary artery without angina pectoris: I25.10

## 2017-12-06 HISTORY — PX: COLONOSCOPY WITH PROPOFOL: SHX5780

## 2017-12-06 LAB — GLUCOSE, CAPILLARY: GLUCOSE-CAPILLARY: 114 mg/dL — AB (ref 70–99)

## 2017-12-06 SURGERY — COLONOSCOPY WITH PROPOFOL
Anesthesia: General

## 2017-12-06 MED ORDER — PROPOFOL 500 MG/50ML IV EMUL
INTRAVENOUS | Status: DC | PRN
  Administered 2017-12-06: 140 ug/kg/min via INTRAVENOUS

## 2017-12-06 MED ORDER — PROPOFOL 10 MG/ML IV BOLUS
INTRAVENOUS | Status: DC | PRN
  Administered 2017-12-06: 70 mg via INTRAVENOUS

## 2017-12-06 MED ORDER — SODIUM CHLORIDE 0.9 % IV SOLN
INTRAVENOUS | Status: DC
  Administered 2017-12-06: 10:00:00 via INTRAVENOUS

## 2017-12-06 NOTE — H&P (Signed)
Jonathon Bellows, MD [address], [place], [place], [place], [address] [id number], [place], [place], [place], [address] Phone: [phone]  Fax: [phone]  Primary Care Physician:  [name], NP   Pre-Procedure History & Physical: HPI:  [name] is a 57 y.o. male is here for an colonoscopy.   Past Medical History:  Diagnosis Date  . Coronary artery disease   . Diabetes mellitus without complication ([facility])   . Dysrhythmia   . GERD (gastroesophageal reflux disease)   . Gout   . Hypertension   . Sleep apnea     Past Surgical History:  Procedure Laterality Date  . BUNIONECTOMY      Prior to Admission medications   Medication Sig Start Date End Date Taking? Authorizing Provider  amLODipine (NORVASC) 10 MG tablet Take 10 mg by mouth daily. 11/24/13  Yes [provider]  aspirin EC 81 MG tablet Take 81 mg by mouth daily.   Yes [provider]  carvedilol (COREG) 25 MG tablet Take 25 mg by mouth 2 (two) times daily with a meal.   Yes [provider]  cloNIDine (CATAPRES) 0.1 MG tablet Take 1 tablet by mouth 2 (two) times daily. 11/24/13  Yes [provider]  COLCRYS 0.6 MG tablet Take 1 tablet by mouth daily. 11/24/13  Yes [provider]  hydrALAZINE (APRESOLINE) 100 MG tablet Take 100 mg by mouth 3 (three) times daily. 10/25/13  Yes [provider]  losartan (COZAAR) 100 MG tablet Take 100 mg by mouth daily. 11/24/13  Yes [provider]  metFORMIN (GLUCOPHAGE) 1000 MG tablet Take 1,000 mg by mouth 2 (two) times daily. 11/24/13  Yes [provider]  metoprolol (LOPRESSOR) 50 MG tablet Take 50 mg by mouth 2 (two) times daily. 11/24/13  Yes [provider]  pravastatin (PRAVACHOL) 40 MG tablet Take 40 mg by mouth daily. 11/24/13  Yes [provider]  spironolactone (ALDACTONE) 25 MG tablet Take 25 mg by mouth daily. 11/24/13  Yes [provider]    oxyCODONE-acetaminophen (PERCOCET/ROXICET) 5-325 MG per tablet Take 1 tablet by mouth every 6 (six) hours as needed. Patient not taking: Reported on 12/06/2017 03/25/14   Milton Ferguson, MD  predniSONE (DELTASONE) 10 MG tablet Take 2 tablets (20 mg total) by mouth daily. Patient not taking: Reported on 12/06/2017 03/25/14   Milton Ferguson, MD    Allergies as of 11/23/2017  . (No Known Allergies)    History reviewed. No pertinent family history.  Social History   Socioeconomic History  . Marital status: Married    Spouse name: Not on file  . Number of children: Not on file  . Years of education: Not on file  . Highest education level: Not on file  Occupational History  . Not on file  Social Needs  . Financial resource strain: Not on file  . Food insecurity:    Worry: Not on file    Inability: Not on file  . Transportation needs:    Medical: Not on file    Non-medical: Not on file  Tobacco Use  . Smoking status: Never Smoker  . Smokeless tobacco: Current User    Types: Chew  Substance and Sexual Activity  . Alcohol use: Yes    Alcohol/week: 12.6 oz    Types: 21 Cans of beer per week    Comment: 2-3 beer dailys  . Drug use: Never  . Sexual activity: Not on file  Lifestyle  . Physical  activity:    Days per week: Not on file    Minutes per session: Not on file  . Stress: Not on file  Relationships  . Social connections:    Talks on phone: Not on file    Gets together: Not on file    Attends religious service: Not on file    Active member of club or organization: Not on file    Attends meetings of clubs or organizations: Not on file    Relationship status: Not on file  . Intimate partner violence:    Fear of current or ex partner: Not on file    Emotionally abused: Not on file    Physically abused: Not on file    Forced sexual activity: Not on file  Other Topics Concern  . Not on file  Social History Narrative  . Not on file    Review of Systems: See HPI,  otherwise negative ROS  Physical Exam: There were no vitals taken for this visit. General:   Alert,  pleasant and cooperative in NAD Head:  Normocephalic and atraumatic. Neck:  Supple; no masses or thyromegaly. Lungs:  Clear throughout to auscultation, normal respiratory effort.    Heart:  +S1, +S2, Regular rate and rhythm, No edema. Abdomen:  Soft, nontender and nondistended. Normal bowel sounds, without guarding, and without rebound.   Neurologic:  Alert and  oriented x4;  grossly normal neurologically.  Impression/Plan: [name] is here for an colonoscopy to be performed for Screening colonoscopy average risk   Risks, benefits, limitations, and alternatives regarding  colonoscopy have been reviewed with the patient.  Questions have been answered.  All parties agreeable.   Jonathon Bellows, MD  12/06/2017, 9:40 AM

## 2017-12-06 NOTE — Anesthesia Post-op Follow-up Note (Signed)
Anesthesia QCDR form completed.        

## 2017-12-06 NOTE — Anesthesia Preprocedure Evaluation (Signed)
Anesthesia Evaluation  Patient identified by MRN, date of birth, ID band Patient awake    Reviewed: Allergy & Precautions, H&P , NPO status , Patient's Chart, lab work & pertinent test results  History of Anesthesia Complications Negative for: history of anesthetic complications  Airway Mallampati: III  TM Distance: <3 FB Neck ROM: limited    Dental  (+) Chipped, Missing, Poor Dentition   Pulmonary sleep apnea ,           Cardiovascular Exercise Tolerance: Good hypertension, (-) angina+ CAD  (-) Past MI and (-) DOE negative cardio ROS  + dysrhythmias      Neuro/Psych negative neurological ROS  negative psych ROS   GI/Hepatic Neg liver ROS, GERD  Medicated and Controlled,  Endo/Other  diabetes, Type 2  Renal/GU negative Renal ROS  negative genitourinary   Musculoskeletal   Abdominal   Peds  Hematology negative hematology ROS (+)   Anesthesia Other Findings Past Medical History: No date: Coronary artery disease No date: Diabetes mellitus without complication ([facility]) No date: Dysrhythmia No date: GERD (gastroesophageal reflux disease) No date: Gout No date: Hypertension No date: Sleep apnea  Past Surgical History: No date: BUNIONECTOMY  BMI    Body Mass Index:  45.77 kg/m      Reproductive/Obstetrics negative OB ROS                             Anesthesia Physical Anesthesia Plan  ASA: III  Anesthesia Plan: General   Post-op Pain Management:    Induction: Intravenous  PONV Risk Score and Plan: Propofol infusion and TIVA  Airway Management Planned: Natural Airway and Nasal Cannula  Additional Equipment:   Intra-op Plan:   Post-operative Plan:   Informed Consent: I have reviewed the patients History and Physical, chart, labs and discussed the procedure including the risks, benefits and alternatives for the proposed anesthesia with the patient or authorized  representative who has indicated his/her understanding and acceptance.   Dental Advisory Given  Plan Discussed with: Anesthesiologist, CRNA and Surgeon  Anesthesia Plan Comments: (Patient consented for risks of anesthesia including but not limited to:  - adverse reactions to medications - risk of intubation if required - damage to teeth, lips or other oral mucosa - sore throat or hoarseness - Damage to heart, brain, lungs or loss of life  Patient voiced understanding.)        Anesthesia Quick Evaluation

## 2017-12-06 NOTE — Op Note (Signed)
[facility] Gastroenterology Patient Name: [name] Procedure Date: 12/06/2017 9:42 AM MRN: [id number] Account #: [id number] Date of Birth: [birth year] Admit Type: Outpatient Age: 57 Room: [facility] ENDO ROOM 3 Gender: Male Note Status: Finalized Procedure:            Colonoscopy Indications:          Screening for colorectal malignant neoplasm Providers:            Jonathon Bellows MD, MD Medicines:            Monitored Anesthesia Care Complications:        No immediate complications. Procedure:            Pre-Anesthesia Assessment:                       - Prior to the procedure, a History and Physical was                        performed, and patient medications, allergies and                        sensitivities were reviewed. The patient's tolerance of                        previous anesthesia was reviewed.                       - The risks and benefits of the procedure and the                        sedation options and risks were discussed with the                        patient. All questions were answered and informed                        consent was obtained.                       - ASA Grade Assessment: III - A patient with severe                        systemic disease.                       After obtaining informed consent, the colonoscope was                        passed under direct vision. Throughout the procedure,                        the patient's blood pressure, pulse, and oxygen                        saturations were monitored continuously. The                        Colonoscope was introduced through the anus and                        advanced to the the cecum, identified by the  appendiceal orifice, IC valve and transillumination.                        The colonoscopy was somewhat difficult due to the                        patient's body habitus. The patient tolerated the                        procedure well.  The quality of the bowel preparation                        was adequate. Findings:      The perianal and digital rectal examinations were normal.      Non-bleeding internal hemorrhoids were found during retroflexion. The       hemorrhoids were medium-sized and Grade I (internal hemorrhoids that do       not prolapse).      The exam was otherwise without abnormality on direct and retroflexion       views.      A 5 mm polyp was found in the ascending colon. The polyp was sessile.       The polyp was removed with a cold snare. Resection and retrieval were       complete. Impression:           - Non-bleeding internal hemorrhoids.                       - The examination was otherwise normal on direct and                        retroflexion views.                       - No specimens collected. Recommendation:       - Discharge patient to home (with escort).                       - Resume previous diet.                       - Continue present medications.                       - Repeat colonoscopy in 10 years for screening purposes. Procedure Code(s):    --- Professional ---                       [phone], Colonoscopy, flexible; with removal of tumor(s),                        polyp(s), or other lesion(s) by snare technique Diagnosis Code(s):    --- Professional ---                       Z12.11, Encounter for screening for malignant neoplasm                        of colon                       K64.0, First degree hemorrhoids CPT copyright 2017 American Medical Association. All rights reserved. The  codes documented in this report are preliminary and upon coder review may  be revised to meet current compliance requirements. Jonathon Bellows, MD Jonathon Bellows MD, MD 12/06/2017 10:25:42 AM This report has been signed electronically. Number of Addenda: 0 Note Initiated On: 12/06/2017 9:42 AM Scope Withdrawal Time: 0 hours 14 minutes 15 seconds  Total Procedure Duration: 0 hours 16 minutes 5 seconds        [facility]

## 2017-12-06 NOTE — Transfer of Care (Signed)
Immediate Anesthesia Transfer of Care Note  Patient: [name]  Procedure(s) Performed: COLONOSCOPY WITH PROPOFOL (N/A )  Patient Location: PACU  Anesthesia Type:General  Level of Consciousness: sedated  Airway & Oxygen Therapy: Patient Spontanous Breathing and Patient connected to nasal cannula oxygen  Post-op Assessment: Report given to RN and Post -op Vital signs reviewed and stable  Post vital signs: Reviewed and stable  Last Vitals:  Vitals Value Taken Time  BP 153/101 12/06/2017 10:30 AM  Temp 36.6 C 12/06/2017 10:30 AM  Pulse 82 12/06/2017 10:30 AM  Resp 14 12/06/2017 10:30 AM  SpO2 97 % 12/06/2017 10:30 AM  Vitals shown include unvalidated device data.  Last Pain:  Vitals:   12/06/17 0947  TempSrc: Tympanic  PainSc: 0-No pain         Complications: No apparent anesthesia complications

## 2017-12-07 LAB — SURGICAL PATHOLOGY

## 2017-12-07 NOTE — Anesthesia Postprocedure Evaluation (Signed)
Anesthesia Post Note  Patient: [name]  Procedure(s) Performed: COLONOSCOPY WITH PROPOFOL (N/A )  Patient location during evaluation: Endoscopy Anesthesia Type: General Level of consciousness: awake and alert Pain management: pain level controlled Vital Signs Assessment: post-procedure vital signs reviewed and stable Respiratory status: spontaneous breathing, nonlabored ventilation, respiratory function stable and patient connected to nasal cannula oxygen Cardiovascular status: blood pressure returned to baseline and stable Postop Assessment: no apparent nausea or vomiting Anesthetic complications: no     Last Vitals:  Vitals:   12/06/17 1050 12/06/17 1100  BP: (!) 159/118 (!) 166/112  Pulse: 81 77  Resp: 19 15  Temp:    SpO2: 98% 99%    Last Pain:  Vitals:   12/06/17 1100  TempSrc:   PainSc: 0-No pain                 Precious Haws Piscitello

## 2017-12-10 ENCOUNTER — Encounter: Payer: Self-pay | Admitting: Gastroenterology

## 2018-02-22 ENCOUNTER — Ambulatory Visit
Admission: RE | Admit: 2018-02-22 | Discharge: 2018-02-22 | Disposition: A | Discharge status: Home / Self Care | Payer: Medicare HMO | Source: Ambulatory Visit | Attending: Nurse Practitioner | Admitting: Nurse Practitioner

## 2018-02-22 ENCOUNTER — Other Ambulatory Visit: Payer: Self-pay | Admitting: Nurse Practitioner

## 2018-02-22 DIAGNOSIS — M25461 Effusion, right knee: Secondary | ICD-10-CM

## 2018-02-22 DIAGNOSIS — M7989 Other specified soft tissue disorders: Secondary | ICD-10-CM | POA: Insufficient documentation

## 2018-03-29 ENCOUNTER — Ambulatory Visit: Payer: Medicare HMO | Admitting: Orthopaedic Surgery

## 2018-03-29 ENCOUNTER — Encounter: Payer: Self-pay | Admitting: Orthopaedic Surgery

## 2018-03-29 VITALS — BP 158/100 | HR 95 | Ht 68.0 in | Wt 305.0 lb

## 2018-03-29 DIAGNOSIS — Z6841 Body Mass Index (BMI) 40.0 and over, adult: Secondary | ICD-10-CM | POA: Diagnosis not present

## 2018-03-29 DIAGNOSIS — M1A0610 Idiopathic chronic gout, right knee, without tophus (tophi): Secondary | ICD-10-CM

## 2018-03-29 MED ORDER — ALLOPURINOL 300 MG PO TABS: 300.0000 mg | ORAL_TABLET | Freq: Every day | ORAL | 5 refills | Status: AC

## 2018-03-29 NOTE — Progress Notes (Signed)
Subjective:    Patient ID: [name], male    DOB: August 25, 1960, 57 y.o.   MRN: [id number]  HPI He has gout.  He had an attack involving the right knee last week.  He was seen at [facility].  He has been on allopurinol 100 daily.  He was given colchicine and diclofenac.  He is much better today.  He has no swelling, no pain.  He is walking well.  He kept the appointment as he had it scheduled already.  This is his second attack this year.  He has no trauma.  He has no redness.  He has no other joint involvement.   Review of Systems  Constitutional: Positive for activity change.  Musculoskeletal: Positive for gait problem and joint swelling.  All other systems reviewed and are negative.  For Review of Systems, all other systems reviewed and are negative.  The following is a summary of the past history medically, past history surgically, known current medicines, social history and family history.  This information is gathered electronically by the computer from prior information and documentation.  I review this each visit and have found including this information at this point in the chart is beneficial and informative.   Past Medical History:  Diagnosis Date  . Coronary artery disease   . Diabetes mellitus without complication ([place])   . Dysrhythmia   . GERD (gastroesophageal reflux disease)   . Gout   . Hypertension   . Sleep apnea     Past Surgical History:  Procedure Laterality Date  . BUNIONECTOMY    . COLONOSCOPY WITH PROPOFOL N/A 12/06/2017   Procedure: COLONOSCOPY WITH PROPOFOL;  Surgeon: Jonathon Bellows, MD;  Location: [facility] ENDOSCOPY;  Service: Gastroenterology;  Laterality: N/A;    Current Outpatient Medications on File Prior to Visit  Medication Sig Dispense Refill  . amLODipine (NORVASC) 10 MG tablet Take 10 mg by mouth daily.    Marland Kitchen aspirin EC 81 MG tablet Take 81 mg by mouth daily.    . carvedilol (COREG) 25 MG tablet Take 25 mg by mouth 2  (two) times daily with a meal.    . cloNIDine (CATAPRES) 0.1 MG tablet Take 1 tablet by mouth 2 (two) times daily.    . hydrALAZINE (APRESOLINE) 100 MG tablet Take 100 mg by mouth 3 (three) times daily.    Marland Kitchen losartan (COZAAR) 100 MG tablet Take 100 mg by mouth daily.    . metFORMIN (GLUCOPHAGE) 1000 MG tablet Take 1,000 mg by mouth 2 (two) times daily.    . metoprolol (LOPRESSOR) 50 MG tablet Take 50 mg by mouth 2 (two) times daily.    . pravastatin (PRAVACHOL) 40 MG tablet Take 40 mg by mouth daily.    Marland Kitchen spironolactone (ALDACTONE) 25 MG tablet Take 25 mg by mouth daily.    . colchicine 0.6 MG tablet Take by mouth.    . diclofenac (VOLTAREN) 75 MG EC tablet      No current facility-administered medications on file prior to visit.     Social History   Socioeconomic History  . Marital status: Married    Spouse name: Not on file  . Number of children: Not on file  . Years of education: Not on file  . Highest education level: Not on file  Occupational History  . Not on file  Social Needs  . Financial resource strain: Not on file  . Food insecurity:    Worry: Not on file  Inability: Not on file  . Transportation needs:    Medical: Not on file    Non-medical: Not on file  Tobacco Use  . Smoking status: Never Smoker  . Smokeless tobacco: Current User    Types: Chew  Substance and Sexual Activity  . Alcohol use: Yes    Alcohol/week: 21.0 standard drinks    Types: 21 Cans of beer per week    Comment: 2-3 beer dailys  . Drug use: Never  . Sexual activity: Not on file  Lifestyle  . Physical activity:    Days per week: Not on file    Minutes per session: Not on file  . Stress: Not on file  Relationships  . Social connections:    Talks on phone: Not on file    Gets together: Not on file    Attends religious service: Not on file    Active member of club or organization: Not on file    Attends meetings of clubs or organizations: Not on file    Relationship status: Not on  file  . Intimate partner violence:    Fear of current or ex partner: Not on file    Emotionally abused: Not on file    Physically abused: Not on file    Forced sexual activity: Not on file  Other Topics Concern  . Not on file  Social History Narrative  . Not on file    Family History  Problem Relation Age of Onset  . Hypertension Mother   . Diabetes Mother   . Heart disease Father   . Hypertension Father   . Hypertension Sister   . Hypertension Brother   . Diabetes Brother     BP (!) 158/100   Pulse 95   Ht 5\' 8"  (1.727 m)   Wt (!) 305 lb (138.3 kg)   BMI 46.38 kg/m    Body mass index is 46.38 kg/m.  The patient meets the AMA guidelines for Morbid (severe) obesity with a BMI > 40.0 and I have recommended weight loss.       Objective:   Physical Exam  Constitutional: He is oriented to person, place, and time. He appears well-developed and well-nourished.  HENT:  Head: Normocephalic and atraumatic.  Eyes: Pupils are equal, round, and reactive to light. Conjunctivae and EOM are normal.  Neck: Normal range of motion. Neck supple.  Cardiovascular: Normal rate, regular rhythm and intact distal pulses.  Pulmonary/Chest: Effort normal.  Abdominal: Soft.  Musculoskeletal:       Legs: Neurological: He is alert and oriented to person, place, and time. He has normal reflexes. He displays normal reflexes. No cranial nerve deficit. He exhibits normal muscle tone. Coordination normal.  Skin: Skin is warm and dry.  Psychiatric: He has a normal mood and affect. His behavior is normal. Judgment and thought content normal.          Assessment & Plan:   Encounter Diagnoses  Name Primary?  . Idiopathic chronic gout of right knee without tophus Yes  . Body mass index 45.0-49.9, adult ([place])   . Morbid obesity ([place])    I have increased his allopurinol dose to 300 daily.  I will see as needed.  Call if any problem.  Precautions discussed.   Electronically  Signed Sanjuana Kava, MD 11/19/20192:04 PM

## 2019-08-18 ENCOUNTER — Encounter: Payer: Self-pay | Admitting: Cardiology

## 2019-08-18 ENCOUNTER — Other Ambulatory Visit: Payer: Self-pay

## 2019-08-18 ENCOUNTER — Ambulatory Visit: Payer: Medicare HMO | Admitting: Cardiology

## 2019-08-18 VITALS — BP 140/80 | HR 86 | Temp 98.2°F | Ht 68.0 in | Wt 317.0 lb

## 2019-08-18 DIAGNOSIS — G4733 Obstructive sleep apnea (adult) (pediatric): Secondary | ICD-10-CM | POA: Diagnosis not present

## 2019-08-18 DIAGNOSIS — Z8679 Personal history of other diseases of the circulatory system: Secondary | ICD-10-CM

## 2019-08-18 DIAGNOSIS — I10 Essential (primary) hypertension: Secondary | ICD-10-CM | POA: Diagnosis not present

## 2019-08-18 DIAGNOSIS — Z9989 Dependence on other enabling machines and devices: Secondary | ICD-10-CM

## 2019-08-18 DIAGNOSIS — E782 Mixed hyperlipidemia: Secondary | ICD-10-CM

## 2019-08-18 MED ORDER — CARVEDILOL 25 MG PO TABS: 37.5000 mg | ORAL_TABLET | Freq: Two times a day (BID) | ORAL | 3 refills | Status: AC

## 2019-08-18 NOTE — Progress Notes (Signed)
Cardiology Office Note  Date: 08/18/2019   ID: [name], DOB 1960/12/22, MRN [id number]  PCP:  Andrew Cara, NP  Cardiologist:  Andrew Lesches, MD Electrophysiologist:  None   Chief Complaint  Patient presents with  . Resistant hypertension    History of Present Illness: [name] is a 59 y.o. male referred for cardiology consultation by Ms. Crawford NP with [facility] for evaluation of resistant hypertension.  I reviewed the available records and updated the chart.  He reports a longstanding history of high blood pressure, has been on medical therapy for quite some time.  [place] in [place].  He reports NYHA class II dyspnea, no exertional chest pain or palpitations.  He states that he has been compliant with his CPAP for treatment of OSA.  He also states that he takes his medications regularly.  I reviewed his current regimen, clonidine was the most recent dose increased by PCP.  Records indicate previous follow-up with [name] in the [facility] back in 2015, I reviewed the note.  These records indicate a history of paroxysmal atrial fibrillation and also possibly cardiomyopathy, although patient was not familiar with either condition when I asked him about that.  I cannot locate any prior testing as it relates to LVEF.  I personally reviewed his ECG today which shows sinus rhythm with poor R wave progression, nonspecific ST changes.   Past Medical History:  Diagnosis Date  . Cardiomyopathy ([place])   . Essential hypertension   . GERD (gastroesophageal reflux disease)   . Gout   . Mixed hyperlipidemia   . Paroxysmal atrial fibrillation ([facility])   . Sleep apnea   . Type 2 diabetes mellitus ([place])     Past Surgical History:  Procedure Laterality Date  . BUNIONECTOMY    . COLONOSCOPY WITH PROPOFOL N/A 12/06/2017   Procedure: COLONOSCOPY WITH PROPOFOL;  Surgeon: Andrew Bellows, MD;  Location: [facility] ENDOSCOPY;   Service: Gastroenterology;  Laterality: N/A;    Current Outpatient Medications  Medication Sig Dispense Refill  . allopurinol (ZYLOPRIM) 300 MG tablet Take 1 tablet (300 mg total) by mouth daily. 30 tablet 5  . amLODipine (NORVASC) 10 MG tablet Take 10 mg by mouth daily.    Marland Kitchen aspirin EC 81 MG tablet Take 81 mg by mouth daily.    . carvedilol (COREG) 25 MG tablet Take 1.5 tablets (37.5 mg total) by mouth 2 (two) times daily with a meal. 270 tablet 3  . cloNIDine (CATAPRES) 0.3 MG tablet     . colchicine 0.6 MG tablet Take by mouth.    . diclofenac (VOLTAREN) 75 MG EC tablet     . hydrALAZINE (APRESOLINE) 100 MG tablet Take 100 mg by mouth 3 (three) times daily.    Marland Kitchen losartan (COZAAR) 100 MG tablet Take 100 mg by mouth daily.    . metFORMIN (GLUCOPHAGE) 1000 MG tablet Take 1,000 mg by mouth 2 (two) times daily.    . pravastatin (PRAVACHOL) 40 MG tablet Take 40 mg by mouth daily.    Marland Kitchen spironolactone (ALDACTONE) 25 MG tablet Take 25 mg by mouth daily.     No current facility-administered medications for this visit.   Allergies:  Patient has no known allergies.   Social History: The patient  reports that he has never smoked. His smokeless tobacco use includes chew. He reports current alcohol use of about 21.0 standard drinks of alcohol per week. He reports that he does not  use drugs.   Family History: The patient's family history includes Diabetes in his brother and mother; Heart disease in his father; Hypertension in his brother, father, mother, and sister.   ROS:   No palpitations or syncope.  Physical Exam: VS:  BP 140/80   Pulse 86   Temp 98.2 F (36.8 C)   Ht 5\' 8"  (1.727 m)   Wt (!) 317 lb (143.8 kg)   SpO2 96%   BMI 48.20 kg/m , BMI Body mass index is 48.2 kg/m.  Wt Readings from Last 3 Encounters:  08/18/19 (!) 317 lb (143.8 kg)  03/29/18 (!) 305 lb (138.3 kg)  12/06/17 (!) 301 lb (136.5 kg)    General: Morbidly obese male, appears comfortable at rest. HEENT:  Conjunctiva and lids normal, wearing a mask. Neck: Supple, no elevated JVP or carotid bruits, no thyromegaly. Lungs: Clear to auscultation, nonlabored breathing at rest. Cardiac: Regular rate and rhythm, no S3, soft systolic murmur, no pericardial rub. Abdomen: Obese, nontender, bowel sounds present, no guarding or rebound. Extremities: 1-2+ lower leg edema, distal pulses 2+. Skin: Warm and dry. Musculoskeletal: No kyphosis. Neuropsychiatric: Alert and oriented x3, affect grossly appropriate.  ECG:  An ECG dated 09/03/2010 was personally reviewed today and demonstrated:  Sinus tachycardia with left atrial enlargement, LVH, leftward axis.  Recent Labwork:  March 2021: BUN 22, creatinine 1.3, potassium 4.4, cholesterol 141, triglycerides 188, HDL 45, LDL 65, hemoglobin A1c 5.8%  Other Studies Reviewed Today:  No prior cardiac testing available for review today.  Assessment and Plan:  1.  Difficult to control hypertension, likely essential and complicated by OSA and also morbid obesity.  He is on multimodal therapy which now includes Norvasc, Coreg, clonidine, hydralazine, Aldactone and Cozaar.  Increase Coreg to 37.5 mg twice daily.  Also refer for follow-up sleep medicine evaluation to ensure adequate treatment with CPAP.  Weight loss would clearly be beneficial, referral to a bariatric center may be warranted.  We will obtain an echocardiogram to assess cardiac structure and function.  Recent lab work reviewed.  2.  History of possible cardiomyopathy based on limited information.  We will obtain an echocardiogram.  3.  History of possible paroxysmal atrial fibrillation based on limited information.  He does not describe any palpitations and is in sinus rhythm today.  Would continue to observe for now.  4.  Morbid obesity, weight loss would be beneficial for his health.  5.  Mixed hyperlipidemia, on Pravachol.  Recent LDL 65.  6.  Type 2 diabetes mellitus, on Glucophage with recent  hemoglobin A1c 5.8%.  Medication Adjustments/Labs and Tests Ordered: Current medicines are reviewed at length with the patient today.  Concerns regarding medicines are outlined above.   Tests Ordered: Orders Placed This Encounter  Procedures  . Ambulatory referral to Sleep Studies  . EKG 12-Lead  . ECHOCARDIOGRAM COMPLETE    Medication Changes: Meds ordered this encounter  Medications  . carvedilol (COREG) 25 MG tablet    Sig: Take 1.5 tablets (37.5 mg total) by mouth 2 (two) times daily with a meal.    Dispense:  270 tablet    Refill:  3    Disposition:  Follow up 6 weeks with [place] in the [place] office.  Signed, Satira Sark, MD, [facility] 08/18/2019 1:36 PM    [facility] [address], [place], [place] [address] Phone: [phone]; Fax: [phone]

## 2019-08-18 NOTE — Patient Instructions (Signed)
Medication Instructions:  INCREASE COREG TO 37.5 MG - TWO TOMES DAILY   Labwork: NONE  Testing/Procedures: Your physician has requested that you have an echocardiogram. Echocardiography is a painless test that uses sound waves to create images of your heart. It provides your doctor with information about the size and shape of your heart and how well your heart's chambers and valves are working. This procedure takes approximately one hour. There are no restrictions for this procedure.    Follow-Up: Your physician recommends that you schedule a follow-up appointment in: 6 WEEKS    Any Other Special Instructions Will Be Listed Below (If Applicable).  You have been referred to SLEEP STUDY     If you need a refill on your cardiac medications before your next appointment, please call your pharmacy.

## 2019-08-30 ENCOUNTER — Ambulatory Visit (HOSPITAL_COMMUNITY)
Admission: RE | Admit: 2019-08-30 | Discharge: 2019-08-30 | Disposition: A | Discharge status: Home / Self Care | Payer: Medicare HMO | Source: Ambulatory Visit | Attending: Cardiology | Admitting: Cardiology

## 2019-08-30 ENCOUNTER — Other Ambulatory Visit: Payer: Self-pay

## 2019-08-30 DIAGNOSIS — K219 Gastro-esophageal reflux disease without esophagitis: Secondary | ICD-10-CM | POA: Diagnosis not present

## 2019-08-30 DIAGNOSIS — Z8679 Personal history of other diseases of the circulatory system: Secondary | ICD-10-CM | POA: Insufficient documentation

## 2019-08-30 DIAGNOSIS — I119 Hypertensive heart disease without heart failure: Secondary | ICD-10-CM | POA: Diagnosis not present

## 2019-08-30 DIAGNOSIS — E119 Type 2 diabetes mellitus without complications: Secondary | ICD-10-CM | POA: Insufficient documentation

## 2019-08-30 DIAGNOSIS — E785 Hyperlipidemia, unspecified: Secondary | ICD-10-CM | POA: Insufficient documentation

## 2019-08-30 DIAGNOSIS — G4730 Sleep apnea, unspecified: Secondary | ICD-10-CM | POA: Insufficient documentation

## 2019-08-30 DIAGNOSIS — E669 Obesity, unspecified: Secondary | ICD-10-CM | POA: Insufficient documentation

## 2019-08-30 NOTE — Progress Notes (Signed)
*  PRELIMINARY RESULTS* Echocardiogram 2D Echocardiogram has been performed.  [name] 08/30/2019, 12:25 PM

## 2019-09-21 ENCOUNTER — Telehealth: Payer: Self-pay

## 2019-09-21 DIAGNOSIS — Z9989 Dependence on other enabling machines and devices: Secondary | ICD-10-CM

## 2019-09-21 NOTE — Telephone Encounter (Signed)
-----   Message from Desma Paganini sent at 09/20/2019  4:26 PM EDT ----- Pt is needing sleep study order placed for split night

## 2019-10-04 NOTE — Progress Notes (Signed)
Cardiology Office Note    Date:  10/05/2019   ID:  [name], DOB [birth year], MRN [id number]  PCP:  [name], NP  Cardiologist: Rozann Lesches, MD    Chief Complaint  Patient presents with  . Follow-up    6-week visit    History of Present Illness:    [name] is a 59 y.o. male with past medical history of remote paroxysmal atrial fibrillation, HTN, HLD, Type II DM and OSA who presents to the office today for 6-week follow-up.  He was examined by Dr. Domenic Polite on 08/18/2019 as a new patient referral for evaluation of resistant hypertension. He reported having baseline dyspnea on exertion but denied any associated chest pain or palpitations. Prior cardiology notes from [facility] had mentioned a history of paroxysmal atrial fibrillation and cardiomyopathy but the patient was unaware of these diagnoses and no prior test results were available for review. He was currently taking Amlodipine 10 mg daily, Coreg 25 mg twice daily, Clonidine 0.3 mg BID, Hydralazine 100 mg 3 times daily, Losartan 100 mg daily and Spironolactone 25 mg daily. His Carvedilol was further titrated to 37.5 mg twice daily and he was referred for an outpatient sleep study. A follow-up echocardiogram was obtained as well and showed a preserved EF of 55 to 60% with no regional wall motion abnormalities. He did have moderate LVH along with grade 2 diastolic dysfunction but no significant valve abnormalities.  In talking with the patient today, he reports having been diagnosed with hypertension when he was a teenager. He is overall unaware of his elevated readings and denies any associated headaches, vision changes or dizziness. He does not have a blood pressure cuff at home. In reviewing his medication regimen, he continued on Coreg at his current dose of 25 mg BID and did not titrate this at the time of his last visit.  He denies any recent chest pain or palpitations. No recent orthopnea, PND or lower  extremity edema. By review of notes, he did have severe obstructive sleep apnea by sleep study in 2010 but he says he has not utilized CPAP in several years due to feeling like the machine was suffocating him.   Past Medical History:  Diagnosis Date  . Cardiomyopathy ([place])   . Essential hypertension   . GERD (gastroesophageal reflux disease)   . Gout   . Mixed hyperlipidemia   . Paroxysmal atrial fibrillation (HCC)   . Sleep apnea   . Type 2 diabetes mellitus ([place])     Past Surgical History:  Procedure Laterality Date  . BUNIONECTOMY    . COLONOSCOPY WITH PROPOFOL N/A 12/06/2017   Procedure: COLONOSCOPY WITH PROPOFOL;  Surgeon: Jonathon Bellows, MD;  Location: [facility] ENDOSCOPY;  Service: Gastroenterology;  Laterality: N/A;    Current Medications: Outpatient Medications Prior to Visit  Medication Sig Dispense Refill  . allopurinol (ZYLOPRIM) 300 MG tablet Take 1 tablet (300 mg total) by mouth daily. 30 tablet 5  . amLODipine (NORVASC) 10 MG tablet Take 10 mg by mouth daily.    Marland Kitchen aspirin EC 81 MG tablet Take 81 mg by mouth daily.    . carvedilol (COREG) 25 MG tablet Take 1.5 tablets (37.5 mg total) by mouth 2 (two) times daily with a meal. 270 tablet 3  . cloNIDine (CATAPRES) 0.3 MG tablet     . colchicine 0.6 MG tablet Take by mouth.    . diclofenac (VOLTAREN) 75 MG EC tablet     . hydrALAZINE (APRESOLINE) 100 MG  tablet Take 100 mg by mouth 3 (three) times daily.    Marland Kitchen losartan (COZAAR) 100 MG tablet Take 100 mg by mouth daily.    . metFORMIN (GLUCOPHAGE) 1000 MG tablet Take 1,000 mg by mouth 2 (two) times daily.    . pravastatin (PRAVACHOL) 40 MG tablet Take 40 mg by mouth daily.    Marland Kitchen spironolactone (ALDACTONE) 25 MG tablet Take 25 mg by mouth daily.     No facility-administered medications prior to visit.     Allergies:   Patient has no known allergies.   Social History   Socioeconomic History  . Marital status: Married    Spouse name: Not on file  . Number of children: Not on  file  . Years of education: Not on file  . Highest education level: Not on file  Occupational History  . Not on file  Tobacco Use  . Smoking status: Never Smoker  . Smokeless tobacco: Current User    Types: Chew  Substance and Sexual Activity  . Alcohol use: Yes    Alcohol/week: 21.0 standard drinks    Types: 21 Cans of beer per week    Comment: 2-3 beer dailys  . Drug use: Never  . Sexual activity: Not on file  Other Topics Concern  . Not on file  Social History Narrative  . Not on file   Social Determinants of Health   Financial Resource Strain:   . Difficulty of Paying Living Expenses:   Food Insecurity:   . Worried About Charity fundraiser in the Last Year:   . Arboriculturist in the Last Year:   Transportation Needs:   . Film/video editor (Medical):   Marland Kitchen Lack of Transportation (Non-Medical):   Physical Activity:   . Days of Exercise per Week:   . Minutes of Exercise per Session:   Stress:   . Feeling of Stress :   Social Connections:   . Frequency of Communication with Friends and Family:   . Frequency of Social Gatherings with Friends and Family:   . Attends Religious Services:   . Active Member of Clubs or Organizations:   . Attends Archivist Meetings:   Marland Kitchen Marital Status:      Family History:  The patient's  family history includes Diabetes in his brother and mother; Heart disease in his father; Hypertension in his brother, father, mother, and sister.   Review of Systems:   Please see the history of present illness.     General:  No chills, fever, night sweats or weight changes.  Cardiovascular:  No chest pain, dyspnea on exertion, edema, orthopnea, palpitations, paroxysmal nocturnal dyspnea. Dermatological: No rash, lesions/masses Respiratory: No cough, dyspnea Urologic: No hematuria, dysuria Abdominal:   No nausea, vomiting, diarrhea, bright red blood per rectum, melena, or hematemesis Neurologic:  No visual changes, wkns, changes in  mental status.  Denies any of the above symptoms.  All other systems reviewed and are otherwise negative except as noted above.   Physical Exam:    VS:  BP (!) 168/102   Pulse 80   Ht 5\' 8"  (1.727 m)   Wt (!) 318 lb 9.6 oz (144.5 kg)   SpO2 96%   BMI 48.44 kg/m    General: Well developed, obese male appearing in no acute distress. Head: Normocephalic, atraumatic, sclera non-icteric.  Neck: No carotid bruits. JVD not elevated.  Lungs: Respirations regular and unlabored, without wheezes or rales.  Heart: Regular rate and rhythm. No  S3 or S4.  No murmur, no rubs, or gallops appreciated. Abdomen: Soft, non-tender, non-distended. No obvious abdominal masses. Msk:  Strength and tone appear normal for age. No obvious joint deformities or effusions. Extremities: No clubbing or cyanosis. No lower extremity edema.  Distal pedal pulses are 2+ bilaterally. Neuro: Alert and oriented X 3. Moves all extremities spontaneously. No focal deficits noted. Psych:  Responds to questions appropriately with a normal affect. Skin: No rashes or lesions noted  Wt Readings from Last 3 Encounters:  10/05/19 (!) 318 lb 9.6 oz (144.5 kg)  08/18/19 (!) 317 lb (143.8 kg)  03/29/18 (!) 305 lb (138.3 kg)     Studies/Labs Reviewed:   EKG:  EKG is not ordered today.   Recent Labs: No results found for requested labs within last 8760 hours.   Lipid Panel    Component Value Date/Time   CHOL  09/05/2010 0615    154        ATP III CLASSIFICATION:  <200     mg/dL   Desirable  200-239  mg/dL   Borderline High  >=240    mg/dL   High          TRIG 167 (H) 09/05/2010 0615   HDL 35 (L) 09/05/2010 0615   CHOLHDL 4.4 09/05/2010 0615   VLDL 33 09/05/2010 0615   LDLCALC  09/05/2010 0615    86        Total Cholesterol/HDL:CHD Risk Coronary Heart Disease Risk Table                     Men   Women  1/2 Average Risk   3.4   3.3  Average Risk       5.0   4.4  2 X Average Risk   9.6   7.1  3 X Average Risk   23.4   11.0        Use the calculated Patient Ratio above and the CHD Risk Table to determine the patient's CHD Risk.        ATP III CLASSIFICATION (LDL):  <100     mg/dL   Optimal  100-129  mg/dL   Near or Above                    Optimal  130-159  mg/dL   Borderline  160-189  mg/dL   High  >190     mg/dL   Very High    Additional studies/ records that were reviewed today include:   Echocardiogram: 08/2019 IMPRESSIONS    1. Left ventricular ejection fraction, by estimation, is 55 to 60%. The  left ventricle has normal function. The left ventricle has no regional  wall motion abnormalities. There is moderate left ventricular hypertrophy  of the basal-septal segment. Left  ventricular diastolic parameters are consistent with Grade II diastolic  dysfunction (pseudonormalization).  2. Right ventricular systolic function is normal. The right ventricular  size is normal.  3. Left atrial size was moderately dilated.  4. Right atrial size was mildly dilated.  5. The mitral valve is grossly normal. No evidence of mitral valve  regurgitation.  6. The aortic valve is tricuspid. Aortic valve regurgitation is not  visualized. No aortic stenosis is present.  7. The inferior vena cava is normal in size with greater than 50%  respiratory variability, suggesting right atrial pressure of 3 mmHg.   Assessment:    1. Resistant hypertension   2. History of cardiomyopathy  3. Mixed hyperlipidemia   4. OSA (obstructive sleep apnea)      Plan:   In order of problems listed above:  1. Resistant Hypertension - BP was initially recorded at 180/110 during today's visit, rechecked personally and slightly improved to 168/102. He is currently taking Amlodipine 10 mg daily, Coreg 25 mg twice daily, Clonidine 0.3 mg BID, Hydralazine 100 mg 3 times daily, Losartan 100 mg daily and Spironolactone 25 mg daily. He did not further titrate Coreg as suggested at the time of his last visit and I  recommended he increase this to 37.5 mg twice daily. Given his body habitus being greater than 85 kg, this could be further titrated to 50mg  BID pending readings. I will reach out to social work today to see if a cuff can be sent to the patient. If he is able to track home readings, I recommended he return a BP log in several weeks. Will plan for a sleep study as outlined below as untreated sleep apnea is likely playing a role in his uncontrolled hypertension.   2. History of Cardiomyopathy - This was listed in prior notes but no records were available for review. Most recent echocardiogram showed a preserved EF of 55 to 60% with no regional motion abnormalities as outlined above. He does have hypertensive heart disease with moderate LVH and grade 2 diastolic dysfunction.  3. HLD - Followed by PCP. He remains on Pravastatin 40 mg daily.   4. OSA - He was diagnosed with severe obstructive sleep apnea by prior sleep study in 2010 but he says he has not utilized his CPAP in several years due to feeling like the machine was suffocating him. Will refer to Pulmonology to arrange for a repeat sleep study. He prefers to have this performed in [facility] if possible as he lives near [place].    Medication Adjustments/Labs and Tests Ordered: Current medicines are reviewed at length with the patient today.  Concerns regarding medicines are outlined above.  Medication changes, Labs and Tests ordered today are listed in the Patient Instructions below. Patient Instructions  Medication Instructions:    START TAKING CARVEDILOL 37.5mg  (1.5 tablets) TWICE DAILY.   Labwork: None  Testing/Procedures: None  Follow-Up: Return Blood Pressure Log in 3-4 weeks.  With Dr. Domenic Polite or Bernerd Pho, PA-C in 3 months.   Any Other Special Instructions Will Be Listed Below (If Applicable).  You have been referred to Pulmonology  If you need a refill on your cardiac medications before your next  appointment, please call your pharmacy.    Signed, Erma Heritage, PA-C  10/05/2019 5:44 PM    [place] [address] [place],  [address] Phone: [phone] Fax: [phone]

## 2019-10-05 ENCOUNTER — Encounter: Payer: Self-pay | Admitting: Student

## 2019-10-05 ENCOUNTER — Other Ambulatory Visit: Payer: Self-pay

## 2019-10-05 ENCOUNTER — Ambulatory Visit (INDEPENDENT_AMBULATORY_CARE_PROVIDER_SITE_OTHER): Payer: Medicare HMO | Admitting: Student

## 2019-10-05 VITALS — BP 168/102 | HR 80 | Ht 68.0 in | Wt 318.6 lb

## 2019-10-05 DIAGNOSIS — I10 Essential (primary) hypertension: Secondary | ICD-10-CM

## 2019-10-05 DIAGNOSIS — G4733 Obstructive sleep apnea (adult) (pediatric): Secondary | ICD-10-CM

## 2019-10-05 DIAGNOSIS — E782 Mixed hyperlipidemia: Secondary | ICD-10-CM | POA: Diagnosis not present

## 2019-10-05 DIAGNOSIS — Z8679 Personal history of other diseases of the circulatory system: Secondary | ICD-10-CM

## 2019-10-05 NOTE — Patient Instructions (Addendum)
Medication Instructions:    START TAKING CARVEDILOL 37.5mg  (1.5 tablets) TWICE DAILY.   Labwork: None  Testing/Procedures: None  Follow-Up: Return Blood Pressure Log in 3-4 weeks.  With Dr. Domenic Polite or Bernerd Pho, PA-C in 3 months.   Any Other Special Instructions Will Be Listed Below (If Applicable).  You have been referred to Pulmonology  If you need a refill on your cardiac medications before your next appointment, please call your pharmacy.

## 2019-10-06 ENCOUNTER — Telehealth: Payer: Self-pay | Admitting: *Deleted

## 2019-10-06 NOTE — Telephone Encounter (Signed)
Staff message sent to schedulers to schedule appointment to see Dr Claiborne Billings for CPAP compliance.

## 2019-10-10 ENCOUNTER — Telehealth: Payer: Self-pay | Admitting: Licensed Clinical Social Worker

## 2019-10-10 NOTE — Telephone Encounter (Signed)
CSW referred to assist patient with obtaining a BP cuff. CSW contacted patient to inform cuff will be delivered to home. Patient grateful for support and assistance. CSW available as needed. Jackie Shaniqwa Horsman, LCSW, CCSW-MCS [phone]  

## 2019-11-27 ENCOUNTER — Encounter (HOSPITAL_COMMUNITY): Payer: Self-pay | Admitting: Emergency Medicine

## 2019-11-27 ENCOUNTER — Other Ambulatory Visit: Payer: Self-pay

## 2019-11-27 ENCOUNTER — Inpatient Hospital Stay (HOSPITAL_COMMUNITY)
Admission: EM | Admit: 2019-11-27 | Discharge: 2019-12-02 | DRG: 292 | Disposition: A | Discharge status: Home / Self Care | Payer: Medicare HMO | Attending: Internal Medicine | Admitting: Internal Medicine

## 2019-11-27 ENCOUNTER — Emergency Department (HOSPITAL_COMMUNITY): Payer: Medicare HMO

## 2019-11-27 DIAGNOSIS — R778 Other specified abnormalities of plasma proteins: Secondary | ICD-10-CM | POA: Diagnosis present

## 2019-11-27 DIAGNOSIS — Z833 Family history of diabetes mellitus: Secondary | ICD-10-CM

## 2019-11-27 DIAGNOSIS — Z8249 Family history of ischemic heart disease and other diseases of the circulatory system: Secondary | ICD-10-CM

## 2019-11-27 DIAGNOSIS — Z20822 Contact with and (suspected) exposure to covid-19: Secondary | ICD-10-CM | POA: Diagnosis present

## 2019-11-27 DIAGNOSIS — I1A0 Resistant hypertension: Secondary | ICD-10-CM | POA: Diagnosis present

## 2019-11-27 DIAGNOSIS — I509 Heart failure, unspecified: Secondary | ICD-10-CM | POA: Diagnosis not present

## 2019-11-27 DIAGNOSIS — G4733 Obstructive sleep apnea (adult) (pediatric): Secondary | ICD-10-CM | POA: Diagnosis present

## 2019-11-27 DIAGNOSIS — I471 Supraventricular tachycardia: Secondary | ICD-10-CM | POA: Diagnosis not present

## 2019-11-27 DIAGNOSIS — I4719 Other supraventricular tachycardia: Secondary | ICD-10-CM | POA: Diagnosis present

## 2019-11-27 DIAGNOSIS — M109 Gout, unspecified: Secondary | ICD-10-CM | POA: Diagnosis present

## 2019-11-27 DIAGNOSIS — Z720 Tobacco use: Secondary | ICD-10-CM

## 2019-11-27 DIAGNOSIS — I110 Hypertensive heart disease with heart failure: Principal | ICD-10-CM | POA: Diagnosis present

## 2019-11-27 DIAGNOSIS — Z7289 Other problems related to lifestyle: Secondary | ICD-10-CM

## 2019-11-27 DIAGNOSIS — I480 Paroxysmal atrial fibrillation: Secondary | ICD-10-CM | POA: Diagnosis present

## 2019-11-27 DIAGNOSIS — Z7982 Long term (current) use of aspirin: Secondary | ICD-10-CM

## 2019-11-27 DIAGNOSIS — I5020 Unspecified systolic (congestive) heart failure: Secondary | ICD-10-CM

## 2019-11-27 DIAGNOSIS — Z6841 Body Mass Index (BMI) 40.0 and over, adult: Secondary | ICD-10-CM

## 2019-11-27 DIAGNOSIS — Z9119 Patient's noncompliance with other medical treatment and regimen: Secondary | ICD-10-CM

## 2019-11-27 DIAGNOSIS — K219 Gastro-esophageal reflux disease without esophagitis: Secondary | ICD-10-CM | POA: Diagnosis present

## 2019-11-27 DIAGNOSIS — T502X5A Adverse effect of carbonic-anhydrase inhibitors, benzothiadiazides and other diuretics, initial encounter: Secondary | ICD-10-CM | POA: Diagnosis not present

## 2019-11-27 DIAGNOSIS — I5043 Acute on chronic combined systolic (congestive) and diastolic (congestive) heart failure: Secondary | ICD-10-CM | POA: Diagnosis present

## 2019-11-27 DIAGNOSIS — Z79899 Other long term (current) drug therapy: Secondary | ICD-10-CM

## 2019-11-27 DIAGNOSIS — N179 Acute kidney failure, unspecified: Secondary | ICD-10-CM | POA: Diagnosis present

## 2019-11-27 DIAGNOSIS — I5033 Acute on chronic diastolic (congestive) heart failure: Secondary | ICD-10-CM | POA: Diagnosis not present

## 2019-11-27 DIAGNOSIS — E782 Mixed hyperlipidemia: Secondary | ICD-10-CM | POA: Diagnosis present

## 2019-11-27 DIAGNOSIS — I428 Other cardiomyopathies: Secondary | ICD-10-CM | POA: Diagnosis present

## 2019-11-27 DIAGNOSIS — E119 Type 2 diabetes mellitus without complications: Secondary | ICD-10-CM

## 2019-11-27 DIAGNOSIS — I10 Essential (primary) hypertension: Secondary | ICD-10-CM | POA: Diagnosis present

## 2019-11-27 DIAGNOSIS — R0603 Acute respiratory distress: Secondary | ICD-10-CM | POA: Diagnosis not present

## 2019-11-27 DIAGNOSIS — R05 Cough: Secondary | ICD-10-CM

## 2019-11-27 DIAGNOSIS — Z7984 Long term (current) use of oral hypoglycemic drugs: Secondary | ICD-10-CM

## 2019-11-27 DIAGNOSIS — I5023 Acute on chronic systolic (congestive) heart failure: Secondary | ICD-10-CM | POA: Diagnosis present

## 2019-11-27 DIAGNOSIS — R059 Cough, unspecified: Secondary | ICD-10-CM

## 2019-11-27 LAB — COMPREHENSIVE METABOLIC PANEL
ALT: 31 U/L (ref 0–44)
AST: 27 U/L (ref 15–41)
Albumin: 3.6 g/dL (ref 3.5–5.0)
Alkaline Phosphatase: 61 U/L (ref 38–126)
Anion gap: 11 (ref 5–15)
BUN: 15 mg/dL (ref 6–20)
CO2: 27 mmol/L (ref 22–32)
Calcium: 8.7 mg/dL — ABNORMAL LOW (ref 8.9–10.3)
Chloride: 104 mmol/L (ref 98–111)
Creatinine, Ser: 1.08 mg/dL (ref 0.61–1.24)
GFR calc Af Amer: >60 mL/min (ref >60)
GFR calc non Af Amer: >60 mL/min (ref >60)
Glucose, Bld: 119 mg/dL — ABNORMAL HIGH (ref 70–99)
Potassium: 3.4 mmol/L — ABNORMAL LOW (ref 3.5–5.1)
Sodium: 142 mmol/L (ref 135–145)
Total Bilirubin: 1.1 mg/dL (ref 0.3–1.2)
Total Protein: 6.6 g/dL (ref 6.5–8.1)

## 2019-11-27 LAB — CBC WITH DIFFERENTIAL/PLATELET
Abs Immature Granulocytes: 0.08 10*3/uL — ABNORMAL HIGH (ref 0.00–0.07)
Basophils Absolute: 0 10*3/uL (ref 0.0–0.1)
Basophils Relative: 0 %
Eosinophils Absolute: 0.1 10*3/uL (ref 0.0–0.5)
Eosinophils Relative: 1 %
HCT: 37.3 % — ABNORMAL LOW (ref 39.0–52.0)
Hemoglobin: 12.3 g/dL — ABNORMAL LOW (ref 13.0–17.0)
Immature Granulocytes: 1 %
Lymphocytes Relative: 11 %
Lymphs Abs: 1.5 10*3/uL (ref 0.7–4.0)
MCH: 31.1 pg (ref 26.0–34.0)
MCHC: 33 g/dL (ref 30.0–36.0)
MCV: 94.4 fL (ref 80.0–100.0)
Monocytes Absolute: 1.1 10*3/uL — ABNORMAL HIGH (ref 0.1–1.0)
Monocytes Relative: 8 %
Neutro Abs: 10.5 10*3/uL — ABNORMAL HIGH (ref 1.7–7.7)
Neutrophils Relative %: 79 %
Platelets: 158 10*3/uL (ref 150–400)
RBC: 3.95 MIL/uL — ABNORMAL LOW (ref 4.22–5.81)
RDW: 13.4 % (ref 11.5–15.5)
WBC: 13.3 10*3/uL — ABNORMAL HIGH (ref 4.0–10.5)
nRBC: 0 % (ref 0.0–0.2)

## 2019-11-27 LAB — MAGNESIUM: Magnesium: 1.4 mg/dL — ABNORMAL LOW (ref 1.7–2.4)

## 2019-11-27 LAB — CBG MONITORING, ED
Glucose-Capillary: 135 mg/dL — ABNORMAL HIGH (ref 70–99)
Glucose-Capillary: 105 mg/dL — ABNORMAL HIGH (ref 70–99)

## 2019-11-27 LAB — TSH: TSH: 2.544 u[IU]/mL (ref 0.350–4.500)

## 2019-11-27 LAB — TROPONIN I (HIGH SENSITIVITY)
Troponin I (High Sensitivity): 47 ng/L — ABNORMAL HIGH (ref <18)
Troponin I (High Sensitivity): 45 ng/L — ABNORMAL HIGH (ref <18)

## 2019-11-27 LAB — GLUCOSE, CAPILLARY: Glucose-Capillary: 138 mg/dL — ABNORMAL HIGH (ref 70–99)

## 2019-11-27 LAB — BRAIN NATRIURETIC PEPTIDE: B Natriuretic Peptide: 390 pg/mL — ABNORMAL HIGH (ref 0.0–100.0)

## 2019-11-27 LAB — SARS CORONAVIRUS 2 BY RT PCR (HOSPITAL ORDER, PERFORMED IN ~~LOC~~ HOSPITAL LAB): SARS Coronavirus 2: NEGATIVE

## 2019-11-27 MED ORDER — METOPROLOL TARTRATE 5 MG/5ML IV SOLN
5.0000 mg | Freq: Once | INTRAVENOUS | Status: AC
  Administered 2019-11-27: 5 mg via INTRAVENOUS
  Filled 2019-11-27: qty 5

## 2019-11-27 MED ORDER — FUROSEMIDE 10 MG/ML IJ SOLN
40.0000 mg | Freq: Once | INTRAMUSCULAR | Status: AC
  Administered 2019-11-27: 40 mg via INTRAVENOUS
  Filled 2019-11-27: qty 4

## 2019-11-27 MED ORDER — DILTIAZEM HCL 25 MG/5ML IV SOLN
10.0000 mg | Freq: Once | INTRAVENOUS | Status: AC
  Administered 2019-11-27: 10 mg via INTRAVENOUS
  Filled 2019-11-27: qty 5

## 2019-11-27 MED ORDER — METFORMIN HCL 500 MG PO TABS
1000.0000 mg | ORAL_TABLET | Freq: Two times a day (BID) | ORAL | Status: DC
  Administered 2019-11-27 (×2): 1000 mg via ORAL
  Filled 2019-11-27 (×2): qty 2

## 2019-11-27 MED ORDER — SODIUM CHLORIDE 0.9% FLUSH: 3.0000 mL | INTRAVENOUS | Status: DC | PRN

## 2019-11-27 MED ORDER — POLYETHYLENE GLYCOL 3350 17 G PO PACK
17.0000 g | PACK | Freq: Every day | ORAL | Status: DC | PRN
  Administered 2019-11-29: 17 g via ORAL
  Filled 2019-11-27: qty 1

## 2019-11-27 MED ORDER — FUROSEMIDE 10 MG/ML IJ SOLN
20.0000 mg | Freq: Once | INTRAMUSCULAR | Status: AC
  Administered 2019-11-28: 20 mg via INTRAVENOUS
  Filled 2019-11-27: qty 2

## 2019-11-27 MED ORDER — ONDANSETRON HCL 4 MG/2ML IJ SOLN: 4.0000 mg | Freq: Four times a day (QID) | INTRAMUSCULAR | Status: DC | PRN

## 2019-11-27 MED ORDER — DILTIAZEM HCL-DEXTROSE 125-5 MG/125ML-% IV SOLN (PREMIX)
5.0000 mg/h | INTRAVENOUS | Status: DC
  Administered 2019-11-27: 5 mg/h via INTRAVENOUS
  Administered 2019-11-27: 20 mg/h via INTRAVENOUS
  Administered 2019-11-29: 5 mg/h via INTRAVENOUS
  Filled 2019-11-27 (×4): qty 125

## 2019-11-27 MED ORDER — TRAZODONE HCL 50 MG PO TABS
50.0000 mg | ORAL_TABLET | Freq: Every evening | ORAL | Status: DC | PRN
  Administered 2019-11-27 – 2019-12-01 (×5): 50 mg via ORAL
  Filled 2019-11-27 (×5): qty 1

## 2019-11-27 MED ORDER — CARVEDILOL 25 MG PO TABS
37.5000 mg | ORAL_TABLET | Freq: Two times a day (BID) | ORAL | Status: DC
  Administered 2019-11-27 – 2019-12-02 (×10): 37.5 mg via ORAL
  Filled 2019-11-27 (×2): qty 1
  Filled 2019-11-27 (×7): qty 3
  Filled 2019-11-27: qty 1

## 2019-11-27 MED ORDER — ACETAMINOPHEN 650 MG RE SUPP: 650.0000 mg | Freq: Four times a day (QID) | RECTAL | Status: DC | PRN

## 2019-11-27 MED ORDER — CLONIDINE HCL 0.3 MG PO TABS
0.3000 mg | ORAL_TABLET | Freq: Two times a day (BID) | ORAL | Status: DC
  Administered 2019-11-27 – 2019-12-02 (×11): 0.3 mg via ORAL
  Filled 2019-11-27 (×11): qty 1

## 2019-11-27 MED ORDER — INSULIN ASPART 100 UNIT/ML ~~LOC~~ SOLN: 0.0000 [IU] | Freq: Every day | SUBCUTANEOUS | Status: DC

## 2019-11-27 MED ORDER — ATORVASTATIN CALCIUM 40 MG PO TABS
40.0000 mg | ORAL_TABLET | Freq: Every day | ORAL | Status: DC
  Administered 2019-11-27 – 2019-12-02 (×6): 40 mg via ORAL
  Filled 2019-11-27 (×6): qty 1

## 2019-11-27 MED ORDER — ADENOSINE 6 MG/2ML IV SOLN: 6.0000 mg | Freq: Once | INTRAVENOUS | Status: AC

## 2019-11-27 MED ORDER — LORAZEPAM 2 MG/ML IJ SOLN
1.0000 mg | Freq: Four times a day (QID) | INTRAMUSCULAR | Status: AC | PRN
  Administered 2019-11-27: 1 mg via INTRAVENOUS
  Filled 2019-11-27: qty 1

## 2019-11-27 MED ORDER — AMLODIPINE BESYLATE 5 MG PO TABS
10.0000 mg | ORAL_TABLET | Freq: Every day | ORAL | Status: DC
  Administered 2019-11-27: 10 mg via ORAL
  Filled 2019-11-27: qty 2

## 2019-11-27 MED ORDER — ADENOSINE 6 MG/2ML IV SOLN
INTRAVENOUS | Status: AC
  Administered 2019-11-27: 6 mg via INTRAVENOUS
  Filled 2019-11-27: qty 6

## 2019-11-27 MED ORDER — ASPIRIN EC 81 MG PO TBEC
81.0000 mg | DELAYED_RELEASE_TABLET | Freq: Every day | ORAL | Status: DC
  Administered 2019-11-27 – 2019-12-02 (×6): 81 mg via ORAL
  Filled 2019-11-27 (×6): qty 1

## 2019-11-27 MED ORDER — ALLOPURINOL 300 MG PO TABS
300.0000 mg | ORAL_TABLET | Freq: Every day | ORAL | Status: DC
  Administered 2019-11-28 – 2019-12-02 (×5): 300 mg via ORAL
  Filled 2019-11-27 (×5): qty 1

## 2019-11-27 MED ORDER — METOPROLOL TARTRATE 5 MG/5ML IV SOLN
5.0000 mg | Freq: Four times a day (QID) | INTRAVENOUS | Status: DC
  Administered 2019-11-27 – 2019-11-30 (×10): 5 mg via INTRAVENOUS
  Filled 2019-11-27 (×11): qty 5

## 2019-11-27 MED ORDER — SPIRONOLACTONE 25 MG PO TABS: 25.0000 mg | ORAL_TABLET | Freq: Every day | ORAL | Status: DC

## 2019-11-27 MED ORDER — HEPARIN SODIUM (PORCINE) 5000 UNIT/ML IJ SOLN
5000.0000 [IU] | Freq: Three times a day (TID) | INTRAMUSCULAR | Status: DC
  Administered 2019-11-27 – 2019-11-30 (×8): 5000 [IU] via SUBCUTANEOUS
  Filled 2019-11-27 (×8): qty 1

## 2019-11-27 MED ORDER — ALBUTEROL SULFATE (2.5 MG/3ML) 0.083% IN NEBU
2.5000 mg | INHALATION_SOLUTION | RESPIRATORY_TRACT | Status: DC | PRN
  Administered 2019-11-27 – 2019-11-30 (×3): 2.5 mg via RESPIRATORY_TRACT
  Filled 2019-11-27 (×3): qty 3

## 2019-11-27 MED ORDER — POTASSIUM CHLORIDE CRYS ER 20 MEQ PO TBCR
40.0000 meq | EXTENDED_RELEASE_TABLET | ORAL | Status: AC
  Administered 2019-11-27 – 2019-11-28 (×2): 40 meq via ORAL
  Filled 2019-11-27 (×2): qty 2

## 2019-11-27 MED ORDER — ONDANSETRON HCL 4 MG PO TABS: 4.0000 mg | ORAL_TABLET | Freq: Four times a day (QID) | ORAL | Status: DC | PRN

## 2019-11-27 MED ORDER — MAGNESIUM SULFATE 4 GM/100ML IV SOLN
4.0000 g | Freq: Once | INTRAVENOUS | Status: AC
  Administered 2019-11-27: 4 g via INTRAVENOUS
  Filled 2019-11-27: qty 100

## 2019-11-27 MED ORDER — CHLORHEXIDINE GLUCONATE CLOTH 2 % EX PADS
6.0000 | MEDICATED_PAD | Freq: Every day | CUTANEOUS | Status: DC
  Administered 2019-11-27 – 2019-12-01 (×5): 6 via TOPICAL

## 2019-11-27 MED ORDER — SODIUM CHLORIDE 0.9% FLUSH
3.0000 mL | Freq: Two times a day (BID) | INTRAVENOUS | Status: DC
  Administered 2019-11-27 – 2019-12-02 (×10): 3 mL via INTRAVENOUS

## 2019-11-27 MED ORDER — ACETAMINOPHEN 325 MG PO TABS
650.0000 mg | ORAL_TABLET | Freq: Four times a day (QID) | ORAL | Status: DC | PRN
  Administered 2019-11-28 – 2019-11-30 (×3): 650 mg via ORAL
  Filled 2019-11-27 (×3): qty 2

## 2019-11-27 MED ORDER — LABETALOL HCL 5 MG/ML IV SOLN
10.0000 mg | INTRAVENOUS | Status: DC | PRN
  Administered 2019-11-27: 10 mg via INTRAVENOUS
  Filled 2019-11-27: qty 4

## 2019-11-27 MED ORDER — LOSARTAN POTASSIUM 50 MG PO TABS
100.0000 mg | ORAL_TABLET | Freq: Every day | ORAL | Status: DC
  Administered 2019-11-27: 100 mg via ORAL
  Filled 2019-11-27: qty 4

## 2019-11-27 MED ORDER — POTASSIUM CHLORIDE CRYS ER 20 MEQ PO TBCR
40.0000 meq | EXTENDED_RELEASE_TABLET | ORAL | Status: AC
  Administered 2019-11-27 (×2): 40 meq via ORAL
  Filled 2019-11-27 (×2): qty 2

## 2019-11-27 MED ORDER — SODIUM CHLORIDE 0.9 % IV SOLN: 250.0000 mL | INTRAVENOUS | Status: DC | PRN

## 2019-11-27 MED ORDER — INSULIN ASPART 100 UNIT/ML ~~LOC~~ SOLN: 0.0000 [IU] | Freq: Three times a day (TID) | SUBCUTANEOUS | Status: DC

## 2019-11-27 NOTE — H&P (Signed)
Patient Demographics:    [name], is a 59 y.o. male  MRN: [id number]   DOB - [birth year]  Admit Date - 11/27/2019  Outpatient Primary MD for the patient is [name], NP   Assessment & Plan:    Active Problems:   Acute exacerbation of CHF (congestive heart failure) (HCC)    1) atrial tachycardia---  requiring IV adenosine initially, and then subsequently received Cardizem and IV metoprolol -EDP discussed case with cardiology who recommended IV Cardizem drip -Tachycardia persisted I discussed case with cardiologist who recommended adding IV metoprolol --May need IV amiodarone if tachycardia persist -Echo from 08/30/2019 noted with EF of 55 to 60% and grade 2 diastolic dysfunction -Replace mag and potassium and check TSH -Continue Coreg  2)HFpEF--congestive heart failure most likely due to #1 above, IV Lasix as ordered monitor renal function closely -Echo as above #1 -Daily weights, fluid input and output charting -Troponins not consistent with ACS -Aldactone as ordered  3) obesity/OSA--CPAP nightly advised  4)HTN-BP is not at goal, continue amlodipine 10 mg daily, Coreg 37.5 mg twice daily, continue losartan 100 mg daily  5)DM2-resume Metformin,, Use Novolog/Humalog Sliding scale insulin with Accu-Cheks/Fingersticks as ordered    Disposition/Need for in-Hospital Stay- patient unable to be discharged at this time due to --atrial tachycardia consistent with worsening heart failure requiring IV Cardizem drip and further adjustments of cardiovascular medications including IV Lasix/diuresis*  Status is: Inpatient  Remains inpatient appropriate because:atrial tachycardia consistent with worsening heart failure requiring IV Cardizem drip and further adjustments of cardiovascular medications  including IV Lasix/diuresis*   Dispo: The patient is from: Home              Anticipated d/c is to: Home              Anticipated d/c date is: 3 days              Patient currently is not medically stable to d/c. Barriers: Not Clinically Stable- -atrial tachycardia consistent with worsening heart failure requiring IV Cardizem drip and further adjustments of cardiovascular medications including IV Lasix/diuresis*  With History of - Reviewed by me  Past Medical History:  Diagnosis Date  . Cardiomyopathy ([place])   . Essential hypertension   . GERD (gastroesophageal reflux disease)   . Gout   . Mixed hyperlipidemia   . Paroxysmal atrial fibrillation (HCC)   . Sleep apnea   . Type 2 diabetes mellitus ([place])       Past Surgical History:  Procedure Laterality Date  . BUNIONECTOMY    . COLONOSCOPY WITH PROPOFOL N/A 12/06/2017   Procedure: COLONOSCOPY WITH PROPOFOL;  Surgeon: Jonathon Bellows, MD;  Location[facility] ENDOSCOPY;  Service: Gastroenterology;  Laterality: N/A;      Chief Complaint  Patient presents with  . Shortness of Breath      HPI:    [name]  is a 59 y.o. male remarkable for morbid obesity, OSA not compliant  with CPAP therapy, prior history of paroxysmal atrial fibrillation in the distant past, HLD, HTN, DM2 and chronic diastolic CHF --presents with worsening shortness of breath and lower extremity edema for about a week now  --Denies frank chest pains or palpitations -Additional history obtained from patient's wife at bedside - -In ED patient was found to be tachycardic requiring IV adenosine initially, and then subsequently received Cardizem and IV metoprolol -EDP discussed case with cardiology who recommended IV Cardizem drip -Tachycardia persisted I discussed case with cardiologist who recommended adding IV metoprolol - No fever  Or chills , COVID-19 negative  - Chest x-ray suggestive of CHF -CBC with a white count of 13.3 with hemoglobin of 12.3 and  platelets of 158 --- Potassium is 3.4,, glucose is 119 , creatinine 1.0 -- Troponin- 47 >>45, magnesium is 1.4   Review of systems:    In addition to the HPI above,   A full Review of  Systems was done, all other systems reviewed are negative except as noted above in HPI , .    Social History:  Reviewed by me    Social History   Tobacco Use  . Smoking status: Never Smoker  . Smokeless tobacco: Current User    Types: Chew  Substance Use Topics  . Alcohol use: Yes    Alcohol/week: 21.0 standard drinks    Types: 21 Cans of beer per week    Comment: 2-3 beer dailys       Family History :  Reviewed by me    Family History  Problem Relation Age of Onset  . Hypertension Mother   . Diabetes Mother   . Heart disease Father   . Hypertension Father   . Hypertension Sister   . Hypertension Brother   . Diabetes Brother      Home Medications:   Prior to Admission medications   Medication Sig Start Date End Date Taking? Authorizing Provider  allopurinol (ZYLOPRIM) 300 MG tablet Take 1 tablet (300 mg total) by mouth daily. 03/29/18  Yes Sanjuana Kava, MD  amLODipine (NORVASC) 10 MG tablet Take 10 mg by mouth daily. 11/24/13  Yes [provider]  aspirin EC 81 MG tablet Take 81 mg by mouth daily.   Yes [provider]  atorvastatin (LIPITOR) 40 MG tablet Take 40 mg by mouth daily. 10/23/19  Yes [provider]  carvedilol (COREG) 25 MG tablet Take 1.5 tablets (37.5 mg total) by mouth 2 (two) times daily with a meal. 08/18/19  Yes Satira Sark, MD  cloNIDine (CATAPRES) 0.3 MG tablet Take 0.3 mg by mouth 2 (two) times daily.  07/18/19  Yes [provider]  losartan (COZAAR) 100 MG tablet Take 100 mg by mouth daily. 11/24/13  Yes [provider]  metFORMIN (GLUCOPHAGE) 1000 MG tablet Take 1,000 mg by mouth 2 (two) times daily. 11/24/13  Yes [provider]  spironolactone (ALDACTONE) 25 MG tablet Take 25 mg by mouth daily.  11/24/13  Yes [provider]     Allergies:    No Known Allergies   Physical Exam:   Vitals  Blood pressure (!) 124/100, pulse (!) 113, temperature 98.7 F (37.1 C), temperature source Oral, resp. rate 18, height 5\' 8"  (1.727 m), weight (!) 144.2 kg, SpO2 96 %.  Physical Examination: General appearance - alert, morbidly obese with conversational dyspnea  -mental status - alert, oriented to person, place, and time,  Eyes - sclera anicteric Neck - supple, no JVD elevation , Chest -diminished  in bases with bibasilar rales  heart - S1 and S2 normal, tachycardic but mostly regular abdomen - soft, nontender, nondistended, increased truncal adiposity  neurological - screening mental status exam normal, neck supple without rigidity, cranial nerves II through XII intact, DTR's normal and symmetric Extremities -2+ pedal edema noted, intact peripheral pulses  Skin - warm, dry     Data Review:    CBC Recent Labs  Lab 11/27/19 0833  WBC 13.3*  HGB 12.3*  HCT 37.3*  PLT 158  MCV 94.4  MCH 31.1  MCHC 33.0  RDW 13.4  LYMPHSABS 1.5  MONOABS 1.1*  EOSABS 0.1  BASOSABS 0.0   ------------------------------------------------------------------------------------------------------------------  Chemistries  Recent Labs  Lab 11/27/19 0833 11/27/19 1030  NA 142  --   K 3.4*  --   CL 104  --   CO2 27  --   GLUCOSE 119*  --   BUN 15  --   CREATININE 1.08  --   CALCIUM 8.7*  --   MG  --  1.4*  AST 27  --   ALT 31  --   ALKPHOS 61  --   BILITOT 1.1  --    ------------------------------------------------------------------------------------------------------------------ estimated creatinine clearance is 106.6 mL/min (by C-G formula based on SCr of 1.08 mg/dL). ------------------------------------------------------------------------------------------------------------------ Recent Labs    11/27/19 0833  TSH 2.544     Coagulation profile No results for input(s):  INR, PROTIME in the last 168 hours. ------------------------------------------------------------------------------------------------------------------- No results for input(s): DDIMER in the last 72 hours. -------------------------------------------------------------------------------------------------------------------  Cardiac Enzymes No results for input(s): CKMB, TROPONINI, MYOGLOBIN in the last 168 hours.  Invalid input(s): CK ------------------------------------------------------------------------------------------------------------------    Component Value Date/Time   BNP 390.0 (H) 11/27/2019 0833     ---------------------------------------------------------------------------------------------------------------  Urinalysis    Component Value Date/Time   COLORURINE AMBER BIOCHEMICALS MAY BE AFFECTED BY COLOR (A) 09/03/2010 2030   APPEARANCEUR CLEAR 09/03/2010 2030   LABSPEC >1.030 (H) 09/03/2010 2030   PHURINE 5.5 09/03/2010 2030   GLUCOSEU NEGATIVE 09/03/2010 2030   [place] NEGATIVE 09/03/2010 2030   HGBUR negative 07/20/2008 1256   BILIRUBINUR SMALL (A) 09/03/2010 2030   KETONESUR NEGATIVE 09/03/2010 2030   PROTEINUR >300 (A) 09/03/2010 2030   UROBILINOGEN 1.0 09/03/2010 2030   NITRITE NEGATIVE 09/03/2010 2030   LEUKOCYTESUR NEGATIVE 09/03/2010 2030    ----------------------------------------------------------------------------------------------------------------   Imaging Results:    DG Chest Port 1 View  Result Date: 11/27/2019 CLINICAL DATA:  Shortness of breath and dry cough EXAM: PORTABLE CHEST 1 VIEW COMPARISON:  09/03/2010 FINDINGS: Cardiac shadow is stable. Lungs are well aerated bilaterally. Increased central vascular congestion is noted with mild interstitial edema. No sizable effusion is noted. No bony abnormality is seen. IMPRESSION: Changes of mild CHF with edema. Electronically Signed   By: Inez Catalina M.D.   On: 11/27/2019 10:02    Radiological Exams  on Admission: DG Chest Port 1 View  Result Date: 11/27/2019 CLINICAL DATA:  Shortness of breath and dry cough EXAM: PORTABLE CHEST 1 VIEW COMPARISON:  09/03/2010 FINDINGS: Cardiac shadow is stable. Lungs are well aerated bilaterally. Increased central vascular congestion is noted with mild interstitial edema. No sizable effusion is noted. No bony abnormality is seen. IMPRESSION: Changes of mild CHF with edema. Electronically Signed   By: Inez Catalina M.D.   On: 11/27/2019 10:02    DVT Prophylaxis -SCD /heparin AM Labs Ordered, also please review Full Orders  Family Communication: Admission, patients condition and plan of care including tests being ordered have been  discussed with the patient and wife who indicate understanding and agree with the plan   Code Status - Full Code  Likely DC to home after improvement in CHF and better rate control  Condition   fair  Roxan Hockey M.D on 11/27/2019 at 8:13 PM Go to [email or web address] -  for contact info  [facility] - Office  [phone]

## 2019-11-27 NOTE — ED Provider Notes (Signed)
[facility] EMERGENCY DEPARTMENT Provider Note   CSN: [id number] Arrival date & time: 11/27/19  0741     History Chief Complaint  Patient presents with  . Shortness of Breath    [name] is a 59 y.o. male.  Patient complains of swelling to his legs some shortness of breath.  Patient has history of heart failure and hypertension  The history is provided by the patient. No language interpreter was used.  Shortness of Breath Severity:  Moderate Onset quality:  Sudden Progression:  Worsening Chronicity:  Recurrent Context: activity   Relieved by:  Nothing Worsened by:  Nothing Ineffective treatments:  None tried Associated symptoms: no abdominal pain, no chest pain, no cough, no headaches and no rash   Risk factors: no recent alcohol use        Past Medical History:  Diagnosis Date  . Cardiomyopathy ([place])   . Essential hypertension   . GERD (gastroesophageal reflux disease)   . Gout   . Mixed hyperlipidemia   . Paroxysmal atrial fibrillation (HCC)   . Sleep apnea   . Type 2 diabetes mellitus [facility])     Patient Active Problem List   Diagnosis Date Noted  . Acute exacerbation of CHF (congestive heart failure) ([place]) 11/27/2019  . SLEEP RELATED HYPOVENTILATION/HYPOXEMIA CCE 08/17/2008  . PROTEINURIA 09/16/2007  . SNORING 08/29/2007  . HYPERGLYCEMIA 06/14/2007  . HYPERLIPIDEMIA 04/27/2006  . GOUT NOS 04/27/2006  . OBESITY NOS 04/27/2006  . 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 BUNIONECTOMY    . COLONOSCOPY WITH PROPOFOL N/A 12/06/2017   Procedure: COLONOSCOPY WITH PROPOFOL;  Surgeon: Jonathon Bellows, MD;  Location: [facility] ENDOSCOPY;  Service: Gastroenterology;  Laterality: N/A;       Family History  Problem Relation Age of Onset  . Hypertension Mother   . Diabetes Mother   . Heart disease Father   . Hypertension Father   . Hypertension Sister   . Hypertension Brother   . Diabetes Brother      Social History   Tobacco Use  . Smoking status: Never Smoker  . Smokeless tobacco: Current User    Types: Chew  Vaping Use  . Vaping Use: Never used  Substance Use Topics  . Alcohol use: Yes    Alcohol/week: 21.0 standard drinks    Types: 21 Cans of beer per week    Comment: 2-3 beer dailys  . Drug use: Never    Home Medications Prior to Admission medications   Medication Sig Start Date End Date Taking? Authorizing Provider  allopurinol (ZYLOPRIM) 300 MG tablet Take 1 tablet (300 mg total) by mouth daily. 03/29/18  Yes Sanjuana Kava, MD  amLODipine (NORVASC) 10 MG tablet Take 10 mg by mouth daily. 11/24/13  Yes [provider]  aspirin EC 81 MG tablet Take 81 mg by mouth daily.   Yes [provider]  atorvastatin (LIPITOR) 40 MG tablet Take 40 mg by mouth daily. 10/23/19  Yes [provider]  carvedilol (COREG) 25 MG tablet Take 1.5 tablets (37.5 mg total) by mouth 2 (two) times daily with a meal. 08/18/19  Yes Satira Sark, MD  cloNIDine (CATAPRES) 0.3 MG tablet Take 0.3 mg by mouth 2 (two) times daily.  07/18/19  Yes [provider]  losartan (COZAAR) 100 MG tablet Take 100 mg by mouth daily. 11/24/13  Yes [provider]  metFORMIN (GLUCOPHAGE) 1000 MG tablet Take 1,000 mg by mouth 2 (two) times daily.  11/24/13  Yes [provider]  spironolactone (ALDACTONE) 25 MG tablet Take 25 mg by mouth daily. 11/24/13  Yes [provider]    Allergies    Patient has no known allergies.  Review of Systems   Review of Systems  Constitutional: Negative for appetite change and fatigue.  HENT: Negative for congestion, ear discharge and sinus pressure.   Eyes: Negative for discharge.  Respiratory: Positive for shortness of breath. Negative for cough.   Cardiovascular: Negative for chest pain.  Gastrointestinal: Negative for abdominal pain and diarrhea.  Genitourinary: Negative for frequency and hematuria.  Musculoskeletal:  Negative for back pain.  Skin: Negative for rash.  Neurological: Negative for seizures and headaches.  Psychiatric/Behavioral: Negative for hallucinations.    Physical Exam Updated Vital Signs BP (!) 151/112   Pulse (!) 115   Temp 98.7 F (37.1 C) (Oral)   Resp 14   Ht 5\' 8"  (1.727 m)   Wt (!) 144.2 kg   SpO2 98%   BMI 48.35 kg/m   Physical Exam Vitals and nursing note reviewed.  Constitutional:      Appearance: He is well-developed.  HENT:     Head: Normocephalic.  Eyes:     General: No scleral icterus.    Conjunctiva/sclera: Conjunctivae normal.  Neck:     Thyroid: No thyromegaly.  Cardiovascular:     Rate and Rhythm: Regular rhythm.     Heart sounds: No murmur heard.  No friction rub. No gallop.      Comments: Tachycardia Pulmonary:     Breath sounds: No stridor. No wheezing or rales.  Chest:     Chest wall: No tenderness.  Abdominal:     General: There is no distension.     Tenderness: There is no abdominal tenderness. There is no rebound.  Musculoskeletal:        General: Normal range of motion.     Cervical back: Neck supple.  Lymphadenopathy:     Cervical: No cervical adenopathy.  Skin:    Findings: No erythema or rash.  Neurological:     Mental Status: He is alert and oriented to person, place, and time.     Motor: No abnormal muscle tone.     Coordination: Coordination normal.  Psychiatric:        Behavior: Behavior normal.     ED Results / Procedures / Treatments   Labs (all labs ordered are listed, but only abnormal results are displayed) Labs Reviewed  CBC WITH DIFFERENTIAL/PLATELET - Abnormal; Notable for the following components:      Result Value   WBC 13.3 (*)    RBC 3.95 (*)    Hemoglobin 12.3 (*)    HCT 37.3 (*)    Neutro Abs 10.5 (*)    Monocytes Absolute 1.1 (*)    Abs Immature Granulocytes 0.08 (*)    All other components within normal limits  COMPREHENSIVE METABOLIC PANEL - Abnormal; Notable for the following components:    Potassium 3.4 (*)    Glucose, Bld 119 (*)    Calcium 8.7 (*)    All other components within normal limits  BRAIN NATRIURETIC PEPTIDE - Abnormal; Notable for the following components:   B Natriuretic Peptide 390.0 (*)    All other components within normal limits  TROPONIN I (HIGH SENSITIVITY) - Abnormal; Notable for the following components:   Troponin I (High Sensitivity) 47 (*)    All other components within normal limits  SARS CORONAVIRUS 2 BY RT PCR (HOSPITAL ORDER, PERFORMED  [facility])  TROPONIN I (HIGH SENSITIVITY)    EKG EKG Interpretation  Date/Time:  Monday November 27 2019 08:11:37 EDT Ventricular Rate:  158 PR Interval:    QRS Duration: 91 QT Interval:  312 QTC Calculation: 506 R Axis:   -52 Text Interpretation: Sinus tachycardia Left anterior fascicular block Abnormal R-wave progression, late transition Prolonged QT interval Confirmed by Milton Ferguson [phone]) on 11/27/2019 8:29:38 AM Also confirmed by Milton Ferguson [phone])  on 11/27/2019 8:31:03 AM Also confirmed by Milton Ferguson [phone])  on 11/27/2019 8:36:15 AM   Radiology DG Chest Port 1 View  Result Date: 11/27/2019 CLINICAL DATA:  Shortness of breath and dry cough EXAM: PORTABLE CHEST 1 VIEW COMPARISON:  09/03/2010 FINDINGS: Cardiac shadow is stable. Lungs are well aerated bilaterally. Increased central vascular congestion is noted with mild interstitial edema. No sizable effusion is noted. No bony abnormality is seen. IMPRESSION: Changes of mild CHF with edema. Electronically Signed   By: Inez Catalina M.D.   On: 11/27/2019 10:02    Procedures Procedures (including critical care time)  Medications Ordered in ED Medications  adenosine (ADENOCARD) 6 MG/2ML injection 6 mg (6 mg Intravenous Given 11/27/19 0843)  metoprolol tartrate (LOPRESSOR) injection 5 mg (5 mg Intravenous Given 11/27/19 0905)  metoprolol tartrate (LOPRESSOR) injection 5 mg (5 mg Intravenous Given 11/27/19 0921)  furosemide (LASIX)  injection 40 mg (40 mg Intravenous Given 11/27/19 1050)  diltiazem (CARDIZEM) injection 10 mg (10 mg Intravenous Given 11/27/19 1050)    ED Course  I have reviewed the triage vital signs and the nursing notes.  Pertinent labs & imaging results that were available during my care of the patient were reviewed by me and considered in my medical decision making (see chart for details). CRITICAL CARE Performed by: Milton Ferguson Total critical care time: 45 minutes Critical care time was exclusive of separately billable procedures and treating other patients. Critical care was necessary to treat or prevent imminent or life-threatening deterioration. Critical care was time spent personally by me on the following activities: development of treatment plan with patient and/or surrogate as well as nursing, discussions with consultants, evaluation of patient's response to treatment, examination of patient, obtaining history from patient or surrogate, ordering and performing treatments and interventions, ordering and review of laboratory studies, ordering and review of radiographic studies, pulse oximetry and re-evaluation of patient's condition. Patient with a rapid rate of 150.  He was given adenosine which did not seem to control his rate.  He was then given Lopressor twice and that decrease his rate down to 10 mg and Dr. Harl Bowie suggested Cardizem he was given another 10 mg of Cardizem.   MDM Rules/Calculators/A&P                          Patient with tachycardia poorly controlled blood pressure and congestive heart failure he will be admitted to medicine with cardiology consult         This patient presents to the ED for concern of shortness of breath this involves an extensive number of treatment options, and is a complaint that carries with it a high risk of complications and morbidity.  The differential diagnosis includes pneumonia heart failure   Lab Tests:   I Ordered, reviewed, and  interpreted labs, which included CBC chemistries troponin BNP.  Patient had elevated troponin elevated BNP and leukocytosis  Medicines ordered:   I ordered medication adenosine Lopressor and Cardizem for tachycardia  Imaging  Studies ordered:   I ordered imaging studies which included chest x-ray and  I independently visualized and interpreted imaging which showed congestive heart failure  Additional history obtained:   Additional history obtained from records  Previous records obtained and reviewed.  Consultations Obtained:   I consulted hospitalist and cardiologist and discussed lab and imaging findings  Reevaluation:  After the interventions stated above, I reevaluated the patient and found improved  Critical Interventions:  .   Final Clinical Impression(s) / ED Diagnoses Final diagnoses:  Systolic congestive heart failure, unspecified HF chronicity ([place])    Rx / DC Orders ED Discharge Orders    None       Milton Ferguson, MD 11/27/19 1110

## 2019-11-27 NOTE — ED Notes (Signed)
Placed pt on 2L nasal cannula to provide comfort and relieve anxiety due to SOB.

## 2019-11-27 NOTE — ED Triage Notes (Signed)
Pt c/o of sob, dry cough and leg swelling x 3 days

## 2019-11-27 NOTE — Progress Notes (Signed)
Nurse called for breathing tx. Patient on side of bed stating he is unable to lie down. Can't breath. Given albuterol neb 5.0 mg , ativan by nurse, Placed on BiPAP 18/6. He did manage to lie back some but most likely he still is in some heart failure.

## 2019-11-27 NOTE — Consult Note (Signed)
Cardiology Consultation:   Patient ID: [name] MRN: [id number]; DOB: May 03, 1961  Admit date: 11/27/2019 Date of Consult: 11/27/2019  Primary Care Provider: [name], NP [facility] Cardiologist: Rozann Lesches, MD  [place] Electrophysiologist:  None    Patient Profile:   [name] is a 59 y.o. male with a hx of HTN, remoted history PAF not on anticoag, DM2, OSA,  who is being seen today for the evaluation of SOB at the request of Dr Roderic Palau.  History of Present Illness:   [name] 59 yo male history of remote PAF, HTN, HL, DM2, OSA, resistant HTN, chronic diastolic HF. Presents with SOB and LE edema. Reports having a cold about 1 week ago. Never fully recovered. Started having progressive SOB and LE edema, orthopnea.   In ER found to be in SVT. Given adenosine x 1, multiple rounds of IV lopressor and diltiazem.    WBC 13.3 Plt 158 K 3.4 BUN 15 BNP 390 hstrop 47--> COVID neg CXR mild CHF EKG SVT  Past Medical History:  Diagnosis Date  . Cardiomyopathy ([place])   . Essential hypertension   . GERD (gastroesophageal reflux disease)   . Gout   . Mixed hyperlipidemia   . Paroxysmal atrial fibrillation (HCC)   . Sleep apnea   . Type 2 diabetes mellitus ([place])     Past Surgical History:  Procedure Laterality Date  . BUNIONECTOMY    . COLONOSCOPY WITH PROPOFOL N/A 12/06/2017   Procedure: COLONOSCOPY WITH PROPOFOL;  Surgeon: Jonathon Bellows, MD;  Location: [facility] ENDOSCOPY;  Service: Gastroenterology;  Laterality: N/A;     Inpatient Medications: Scheduled Meds:  Continuous Infusions:  PRN Meds:   Allergies:   No Known Allergies  Social History:   Social History   Socioeconomic History  . Marital status: Married    Spouse name: Not on file  . Number of children: Not on file  . Years of education: Not on file  . Highest education level: Not on file  Occupational History  . Not on file  Tobacco Use  . Smoking status: Never  Smoker  . Smokeless tobacco: Current User    Types: Chew  Vaping Use  . Vaping Use: Never used  Substance and Sexual Activity  . Alcohol use: Yes    Alcohol/week: 21.0 standard drinks    Types: 21 Cans of beer per week    Comment: 2-3 beer dailys  . Drug use: Never  . Sexual activity: Not on file  Other Topics Concern  . Not on file  Social History Narrative  . Not on file   Social Determinants of Health   Financial Resource Strain:   . Difficulty of Paying Living Expenses:   Food Insecurity:   . Worried About Charity fundraiser in the Last Year:   . Arboriculturist in the Last Year:   Transportation Needs:   . Film/video editor (Medical):   Marland Kitchen Lack of Transportation (Non-Medical):   Physical Activity:   . Days of Exercise per Week:   . Minutes of Exercise per Session:   Stress:   . Feeling of Stress :   Social Connections:   . Frequency of Communication with Friends and Family:   . Frequency of Social Gatherings with Friends and Family:   . Attends Religious Services:   . Active Member of Clubs or Organizations:   . Attends Archivist Meetings:   Marland Kitchen Marital Status:   Intimate Partner Violence:   .  Fear of Current or Ex-Partner:   . Emotionally Abused:   Marland Kitchen Physically Abused:   . Sexually Abused:     Family History:    Family History  Problem Relation Age of Onset  . Hypertension Mother   . Diabetes Mother   . Heart disease Father   . Hypertension Father   . Hypertension Sister   . Hypertension Brother   . Diabetes Brother      ROS:  Please see the history of present illness.  All other ROS reviewed and negative.     Physical Exam/Data:   Vitals:   11/27/19 0906 11/27/19 0922 11/27/19 0930 11/27/19 1045  BP:  (!) 155/115 (!) 149/123 (!) 151/112  Pulse:  (!) 113 (!) 120 (!) 115  Resp:  17 (!) 30 14  Temp:      TempSrc:      SpO2: 96% 99% 99% 98%  Weight:      Height:       No intake or output data in the 24 hours ending 11/27/19  1102 Last 3 Weights 11/27/2019 10/05/2019 08/18/2019  Weight (lbs) 318 lb 318 lb 9.6 oz 317 lb  Weight (kg) 144.244 kg 144.516 kg 143.79 kg     Body mass index is 48.35 kg/m.  General:  Well nourished, well developed, in no acute distress HEENT: normal Lymph: no adenopathy Neck: no JVD Endocrine:  No thryomegaly Vascular: No carotid bruits; FA pulses 2+ bilaterally without bruits  Cardiac:  Regular, tachy Lungs:  clear to auscultation bilaterally, no wheezing, rhonchi or rales  Abd: soft, nontender, no hepatomegaly  Ext: 1+ bilateral LE edema Musculoskeletal:  No deformities, BUE and BLE strength normal and equal Skin: warm and dry  Neuro:  CNs 2-12 intact, no focal abnormalities noted Psych:  Normal affect     Relevant CV Studies:  08/2019 echo LVEF 55-60%, grade II DDx, normal RV, mod LAE      Laboratory Data:  High Sensitivity Troponin:   Recent Labs  Lab 11/27/19 0833  TROPONINIHS 47*     Chemistry Recent Labs  Lab 11/27/19 0833  NA 142  K 3.4*  CL 104  CO2 27  GLUCOSE 119*  BUN 15  CREATININE 1.08  CALCIUM 8.7*  GFRNONAA >60  GFRAA >60  ANIONGAP 11    Recent Labs  Lab 11/27/19 0833  PROT 6.6  ALBUMIN 3.6  AST 27  ALT 31  ALKPHOS 61  BILITOT 1.1   Hematology Recent Labs  Lab 11/27/19 0833  WBC 13.3*  RBC 3.95*  HGB 12.3*  HCT 37.3*  MCV 94.4  MCH 31.1  MCHC 33.0  RDW 13.4  PLT 158   BNP Recent Labs  Lab 11/27/19 0833  BNP 390.0*    DDimer No results for input(s): DDIMER in the last 168 hours.   Radiology/Studies:  DG Chest Port 1 View  Result Date: 11/27/2019 CLINICAL DATA:  Shortness of breath and dry cough EXAM: PORTABLE CHEST 1 VIEW COMPARISON:  09/03/2010 FINDINGS: Cardiac shadow is stable. Lungs are well aerated bilaterally. Increased central vascular congestion is noted with mild interstitial edema. No sizable effusion is noted. No bony abnormality is seen. IMPRESSION: Changes of mild CHF with edema. Electronically  Signed   By: Inez Catalina M.D.   On: 11/27/2019 10:02   {  Assessment and Plan:   1. Acute on chronic diastolic HF - likely exacerbated by his tachycarrhythmia - 08/2019 echo LVEF 55-60%, grade II DDx.  - redose IV lasix 40mg  additioanlly  later today, reevalaute diuretic need tomorrow.   - repeat limited echo once better rate controlled.    2. Tachycardia - potential atrial tachcyardia, difficult to determine. Tele shows some intermittent slow rates which show a clear flast baseline arguing against aflutter.  - repeat EKG, for now dilt IV gtt for rate control.  - keep K at 4 Mg at 2 - f/u telemetry  3. Severe OSA - had not been on CPAP at home due to discomfort - ongoing workup as outpatient.     For questions or updates, please contact [place] Please consult [email or web address] for contact info under    Signed, Carlyle Dolly, MD  11/27/2019 11:02 AM

## 2019-11-27 NOTE — ED Notes (Signed)
Per Courage, MD, adjust Cardizem drip rate to 20mg /hr via verbal telephone order.

## 2019-11-28 ENCOUNTER — Observation Stay (HOSPITAL_COMMUNITY): Payer: Medicare HMO

## 2019-11-28 DIAGNOSIS — Z6841 Body Mass Index (BMI) 40.0 and over, adult: Secondary | ICD-10-CM | POA: Diagnosis not present

## 2019-11-28 DIAGNOSIS — I5023 Acute on chronic systolic (congestive) heart failure: Secondary | ICD-10-CM | POA: Diagnosis present

## 2019-11-28 DIAGNOSIS — I471 Supraventricular tachycardia: Secondary | ICD-10-CM | POA: Diagnosis present

## 2019-11-28 DIAGNOSIS — G4733 Obstructive sleep apnea (adult) (pediatric): Secondary | ICD-10-CM | POA: Diagnosis present

## 2019-11-28 DIAGNOSIS — I110 Hypertensive heart disease with heart failure: Secondary | ICD-10-CM | POA: Diagnosis present

## 2019-11-28 DIAGNOSIS — I480 Paroxysmal atrial fibrillation: Secondary | ICD-10-CM | POA: Diagnosis present

## 2019-11-28 DIAGNOSIS — I428 Other cardiomyopathies: Secondary | ICD-10-CM | POA: Diagnosis present

## 2019-11-28 DIAGNOSIS — R0603 Acute respiratory distress: Secondary | ICD-10-CM | POA: Diagnosis not present

## 2019-11-28 DIAGNOSIS — Z7984 Long term (current) use of oral hypoglycemic drugs: Secondary | ICD-10-CM | POA: Diagnosis not present

## 2019-11-28 DIAGNOSIS — Z7289 Other problems related to lifestyle: Secondary | ICD-10-CM | POA: Diagnosis not present

## 2019-11-28 DIAGNOSIS — I5043 Acute on chronic combined systolic (congestive) and diastolic (congestive) heart failure: Secondary | ICD-10-CM | POA: Diagnosis present

## 2019-11-28 DIAGNOSIS — E119 Type 2 diabetes mellitus without complications: Secondary | ICD-10-CM

## 2019-11-28 DIAGNOSIS — K219 Gastro-esophageal reflux disease without esophagitis: Secondary | ICD-10-CM | POA: Diagnosis present

## 2019-11-28 DIAGNOSIS — Z20822 Contact with and (suspected) exposure to covid-19: Secondary | ICD-10-CM | POA: Diagnosis present

## 2019-11-28 DIAGNOSIS — R778 Other specified abnormalities of plasma proteins: Secondary | ICD-10-CM | POA: Diagnosis present

## 2019-11-28 DIAGNOSIS — I5033 Acute on chronic diastolic (congestive) heart failure: Secondary | ICD-10-CM | POA: Diagnosis not present

## 2019-11-28 DIAGNOSIS — I5020 Unspecified systolic (congestive) heart failure: Secondary | ICD-10-CM | POA: Diagnosis not present

## 2019-11-28 DIAGNOSIS — E782 Mixed hyperlipidemia: Secondary | ICD-10-CM | POA: Diagnosis present

## 2019-11-28 DIAGNOSIS — Z79899 Other long term (current) drug therapy: Secondary | ICD-10-CM | POA: Diagnosis not present

## 2019-11-28 DIAGNOSIS — I509 Heart failure, unspecified: Secondary | ICD-10-CM | POA: Diagnosis present

## 2019-11-28 DIAGNOSIS — Z720 Tobacco use: Secondary | ICD-10-CM | POA: Diagnosis not present

## 2019-11-28 DIAGNOSIS — Z8249 Family history of ischemic heart disease and other diseases of the circulatory system: Secondary | ICD-10-CM | POA: Diagnosis not present

## 2019-11-28 DIAGNOSIS — N179 Acute kidney failure, unspecified: Secondary | ICD-10-CM | POA: Diagnosis present

## 2019-11-28 DIAGNOSIS — Z833 Family history of diabetes mellitus: Secondary | ICD-10-CM | POA: Diagnosis not present

## 2019-11-28 DIAGNOSIS — Z9119 Patient's noncompliance with other medical treatment and regimen: Secondary | ICD-10-CM | POA: Diagnosis not present

## 2019-11-28 DIAGNOSIS — Z7982 Long term (current) use of aspirin: Secondary | ICD-10-CM | POA: Diagnosis not present

## 2019-11-28 DIAGNOSIS — M109 Gout, unspecified: Secondary | ICD-10-CM | POA: Diagnosis present

## 2019-11-28 DIAGNOSIS — I10 Essential (primary) hypertension: Secondary | ICD-10-CM | POA: Diagnosis not present

## 2019-11-28 LAB — GLUCOSE, CAPILLARY
Glucose-Capillary: 126 mg/dL — ABNORMAL HIGH (ref 70–99)
Glucose-Capillary: 130 mg/dL — ABNORMAL HIGH (ref 70–99)
Glucose-Capillary: 108 mg/dL — ABNORMAL HIGH (ref 70–99)
Glucose-Capillary: 112 mg/dL — ABNORMAL HIGH (ref 70–99)

## 2019-11-28 LAB — COMPREHENSIVE METABOLIC PANEL
ALT: 27 U/L (ref 0–44)
AST: 18 U/L (ref 15–41)
Albumin: 3.4 g/dL — ABNORMAL LOW (ref 3.5–5.0)
Alkaline Phosphatase: 62 U/L (ref 38–126)
Anion gap: 12 (ref 5–15)
BUN: 23 mg/dL — ABNORMAL HIGH (ref 6–20)
CO2: 25 mmol/L (ref 22–32)
Calcium: 8.7 mg/dL — ABNORMAL LOW (ref 8.9–10.3)
Chloride: 103 mmol/L (ref 98–111)
Creatinine, Ser: 1.81 mg/dL — ABNORMAL HIGH (ref 0.61–1.24)
GFR calc Af Amer: 47 mL/min — ABNORMAL LOW (ref >60)
GFR calc non Af Amer: 41 mL/min — ABNORMAL LOW (ref >60)
Glucose, Bld: 126 mg/dL — ABNORMAL HIGH (ref 70–99)
Potassium: 4.5 mmol/L (ref 3.5–5.1)
Sodium: 140 mmol/L (ref 135–145)
Total Bilirubin: 1.5 mg/dL — ABNORMAL HIGH (ref 0.3–1.2)
Total Protein: 6.8 g/dL (ref 6.5–8.1)

## 2019-11-28 LAB — ECHOCARDIOGRAM LIMITED
Area-P 1/2: 6.71 cm2
Calc EF: 43.2 %
Height: 67 in
S' Lateral: 4.81 cm
Single Plane A2C EF: 43.1 %
Single Plane A4C EF: 42.5 %
Weight: 5005.32 oz

## 2019-11-28 LAB — CBC
HCT: 38.1 % — ABNORMAL LOW (ref 39.0–52.0)
Hemoglobin: 12.7 g/dL — ABNORMAL LOW (ref 13.0–17.0)
MCH: 31.8 pg (ref 26.0–34.0)
MCHC: 33.3 g/dL (ref 30.0–36.0)
MCV: 95.5 fL (ref 80.0–100.0)
Platelets: 177 10*3/uL (ref 150–400)
RBC: 3.99 MIL/uL — ABNORMAL LOW (ref 4.22–5.81)
RDW: 13.4 % (ref 11.5–15.5)
WBC: 11.9 10*3/uL — ABNORMAL HIGH (ref 4.0–10.5)
nRBC: 0 % (ref 0.0–0.2)

## 2019-11-28 LAB — MAGNESIUM: Magnesium: 2.3 mg/dL (ref 1.7–2.4)

## 2019-11-28 LAB — MRSA PCR SCREENING: MRSA by PCR: NEGATIVE

## 2019-11-28 LAB — HIV ANTIBODY (ROUTINE TESTING W REFLEX): HIV Screen 4th Generation wRfx: NONREACTIVE

## 2019-11-28 LAB — TSH: TSH: 2.054 u[IU]/mL (ref 0.350–4.500)

## 2019-11-28 MED ORDER — HYDROCODONE-ACETAMINOPHEN 7.5-325 MG PO TABS
1.0000 | ORAL_TABLET | Freq: Four times a day (QID) | ORAL | Status: DC | PRN
  Administered 2019-11-28 – 2019-11-29 (×3): 1 via ORAL
  Filled 2019-11-28 (×3): qty 1

## 2019-11-28 NOTE — Progress Notes (Signed)
Patient placed on full face BIPAP.  Patient tolerating well at this time.  RT will continue to monitor.

## 2019-11-28 NOTE — Progress Notes (Addendum)
Progress Note  Patient Name: [name] Date of Encounter: 11/28/2019  [facility] Cardiologist: Andrew Lesches, MD   Subjective   Intiially some increased SOB last night, today breathing is "much improved"  Inpatient Medications    Scheduled Meds: . allopurinol  300 mg Oral Daily  . amLODipine  10 mg Oral Daily  . aspirin EC  81 mg Oral Daily  . atorvastatin  40 mg Oral Daily  . carvedilol  37.5 mg Oral BID WC  . Chlorhexidine Gluconate Cloth  6 each Topical Daily  . cloNIDine  0.3 mg Oral BID  . heparin  5,000 Units Subcutaneous Q8H  . insulin aspart  0-5 Units Subcutaneous QHS  . insulin aspart  0-6 Units Subcutaneous TID WC  . metoprolol tartrate  5 mg Intravenous Q6H  . sodium chloride flush  3 mL Intravenous Q12H   Continuous Infusions: . sodium chloride    . diltiazem (CARDIZEM) infusion 10 mg/hr (11/28/19 0400)   PRN Meds: sodium chloride, acetaminophen **OR** acetaminophen, albuterol, labetalol, ondansetron **OR** ondansetron (ZOFRAN) IV, polyethylene glycol, sodium chloride flush, traZODone   Vital Signs    Vitals:   11/28/19 0545 11/28/19 0600 11/28/19 0700 11/28/19 0807  BP: 110/74 (!) 97/59 92/65   Pulse: (!) 108 (!) 107 100   Resp: 16 17 16    Temp:    98 F (36.7 C)  TempSrc:    Oral  SpO2: 91% (!) 88% 93%   Weight:      Height:        Intake/Output Summary (Last 24 hours) at 11/28/2019 0824 Last data filed at 11/28/2019 0400 Gross per 24 hour  Intake 237.77 ml  Output --  Net 237.77 ml   Last 3 Weights 11/28/2019 11/27/2019 11/27/2019  Weight (lbs) 312 lb 13.3 oz 312 lb 13.3 oz 318 lb  Weight (kg) 141.9 kg 141.9 kg 144.244 kg      Telemetry    SInus, runs of atach - Personally Reviewed  ECG    n/a- Personally Reviewed  Physical Exam   GEN: No acute distress.   Neck: No JVD Cardiac: RRR, no murmurs, rubs, or gallops.  Respiratory: Clear to auscultation bilaterally. GI: Soft, nontender, non-distended  MS: No edema;  No deformity. Neuro:  Nonfocal  Psych: Normal affect   Labs    High Sensitivity Troponin:   Recent Labs  Lab 11/27/19 0833 11/27/19 1030  TROPONINIHS 47* 45*      Chemistry Recent Labs  Lab 11/27/19 0833 11/28/19 0514  NA 142 140  K 3.4* 4.5  CL 104 103  CO2 27 25  GLUCOSE 119* 126*  BUN 15 23*  CREATININE 1.08 1.81*  CALCIUM 8.7* 8.7*  PROT 6.6 6.8  ALBUMIN 3.6 3.4*  AST 27 18  ALT 31 27  ALKPHOS 61 62  BILITOT 1.1 1.5*  GFRNONAA >60 41*  GFRAA >60 47*  ANIONGAP 11 12     Hematology Recent Labs  Lab 11/27/19 0833 11/28/19 0514  WBC 13.3* 11.9*  RBC 3.95* 3.99*  HGB 12.3* 12.7*  HCT 37.3* 38.1*  MCV 94.4 95.5  MCH 31.1 31.8  MCHC 33.0 33.3  RDW 13.4 13.4  PLT 158 177    BNP Recent Labs  Lab 11/27/19 0833  BNP 390.0*     DDimer No results for input(s): DDIMER in the last 168 hours.   Radiology    DG Chest Port 1 View  Result Date: 11/27/2019 CLINICAL DATA:  Shortness of breath and dry cough EXAM:  PORTABLE CHEST 1 VIEW COMPARISON:  09/03/2010 FINDINGS: Cardiac shadow is stable. Lungs are well aerated bilaterally. Increased central vascular congestion is noted with mild interstitial edema. No sizable effusion is noted. No bony abnormality is seen. IMPRESSION: Changes of mild CHF with edema. Electronically Signed   By: [name] M.D.   On: 11/27/2019 10:02    Cardiac Studies     Patient Profile     [name] 59 yo male history of remote PAF, HTN, HL, DM2, OSA, resistant HTN, chronic diastolic HF. Presents with SOB and LE edema. Reports having a cold about 1 week ago. Never fully recovered. Started having progressive SOB and LE edema, orthopnea.    Assessment & Plan    1. Acute on chronic diastolic HF - 87% on RA at admission. Signs of fluid overload by exam. Elevated BNP.  - diastolic HF likely exacerbated by his tachycarrhythmia - 08/2019 echo LVEF 55-60%, grade II DDx.    I/Os are incomplete from yesterday, he urinated  several times in the ER. Edema is much improved today. SIgnifncant uptrend in Cr, hold on diuretics today - repeat limited echo once better rate controlled.    2. Tachycardia - narrow complex regular tachycardia, difficult to determine. Did not break with adensone in the ER. Refractory to CCB and BB initially Tele shows some intermittent slow rates which show a clear flat baseline arguing against aflutter.  - tele overnight shows rates 100s, looks to be sinus P waves followed by runs of ectopic atach which are occasionally not conducted leading to short sinus pause, followed by sinus P wave and then recurrent ectopic atach. EKG today shows P waves of different morphology from 08/2019 EKG.  - continue IV diltiazem, IV lopressor - may need EP to see this admission pending rate control   3. Severe OSA - had not been on CPAP at home due to discomfort - ongoing workup as outpatient.    For questions or updates, please contact [place] Please consult [email or web address] for contact info under        Signed, Andrew Dolly, MD  11/28/2019, 8:24 AM

## 2019-11-28 NOTE — Progress Notes (Signed)
Patient Demographics:    [name], is a 59 y.o. male, DOB - 1960/06/27, IWP:[id number]  Admit date - 11/27/2019   Admitting Physician Kimiyo Carmicheal Denton Brick, MD  Outpatient Primary MD for the patient is [name], NP  LOS - 0   Chief Complaint  Patient presents with  . Shortness of Breath        Subjective:    [name] today has no fevers, no emesis,  No chest pain,   -Orthopnea and dyspnea persist -Voiding well  Assessment  & Plan :    Principal Problem:   Acute on chronic HFrEF (heart failure with reduced ejection fraction)-Combined Systolic and Diastolic dysfunction CHF Active Problems:   Acute exacerbation of CHF (congestive heart failure) (HCC)   Atrial tachycardia (HCC)   Resistant hypertension   OSA (obstructive sleep apnea)   DM2 (diabetes mellitus, type 2) (HCC)   Obesities, morbid ([name])   Brief Summary:-  59 y.o. male remarkable for morbid obesity, OSA not compliant with CPAP therapy, prior history of paroxysmal atrial fibrillation in the distant past, HLD, HTN, DM2 and chronic diastolic CHF --presented to ED with worsening shortness of breath and lower extremity edema for about a week now  -Admitted 11/27/2027 with acute combined systolic and diastolic CHF exacerbation in the setting of persistent atrial tachycardia   A/p 1)HFrEF--- patient with acute on chronic combined systolic and diastolic dysfunction CHF, echo from 11/28/2019 with EF down to 40 to 45% compared to echo from 08/30/2019 when EF 4055 to 60% -Wall motion abnormalities and grade 2 diastolic dysfunction noted -Treated with IV Lasix but developed AKI -Continue daily weights and fluid input and output monitoring -Drop in EF noted but troponins are not consistent with ACS -Discussed with Dr. Harl Bowie from cardiology service -Cardiologist advised "diuretic holiday" given significant  jump in  creatinine -We will hold losartan, discontinue Aldactone for now due to AKI -continue Coreg  2)Persistent Atrial Tachycardia--narrow complex mostly regular tachycardia initially failed IV adenosine, subsequently treated with IV Cardizem and IV metoprolol -PTA patient was on Coreg we will continue this -Electrolytes were replaced -TSH 2.054, mag is 2.3, potassium is 4.5 -Echocardiogram as noted above -Cardiologist advised continuing IV Cardizem and IV metoprolol for now, if patient fails to respond significantly over the next 24 hours will consider transfer to [facility] for EP cardiology evaluation  3) obesity/OSA--- compliance with CPAP strongly advised  4)AKI----acute kidney injury --due to diuretics -   creatinine on admission=1.0  , baseline creatinine =1.0   , creatinine is now=1.81  ,  ---renally adjust medications, avoid nephrotoxic agents / dehydration  / hypotension --We will hold losartan, discontinue Aldactone for now due to AKI  5)DM2-no recent A1c, hold Metformin due to AKI, Use Novolog/Humalog Sliding scale insulin with Accu-Cheks/Fingersticks as ordered   6)HTN- -We will hold losartan, discontinue Aldactone for now due to AKI, continue IV Cardizem and metoprolol as above #1 -Continue Coreg and clonidine  may use IV labetalol when necessary  Every 4 hours for systolic blood pressure over 170 mmhg   Disposition/Need for in-Hospital Stay- patient unable to be discharged at this time due to --- difficulties with rate control despite IV Cardizem and IV metoprolol, worsening renal function with diuretics in the setting of  CHF requiring diuresis  Status is: Inpatient  Remains inpatient appropriate because:difficulties with rate control despite IV Cardizem and IV metoprolol, worsening renal function with diuretics in the setting of CHF requiring diuresis   Disposition: The patient is from: Home              Anticipated d/c is to: Home              Anticipated d/c date is: 2  days              Patient currently is not medically stable to d/c. Barriers: Not Clinically Stable- difficulties with rate control despite IV Cardizem and IV metoprolol, worsening renal function with diuretics in the setting of CHF requiring diuresis  Code Status : full code  Family Communication:  (patient is alert, awake and coherent)  Consults  :  cardio  DVT Prophylaxis  :   - Heparin - SCDs   Lab Results  Component Value Date   PLT 177 11/28/2019    Inpatient Medications  Scheduled Meds: . allopurinol  300 mg Oral Daily  . aspirin EC  81 mg Oral Daily  . atorvastatin  40 mg Oral Daily  . carvedilol  37.5 mg Oral BID WC  . Chlorhexidine Gluconate Cloth  6 each Topical Daily  . cloNIDine  0.3 mg Oral BID  . heparin  5,000 Units Subcutaneous Q8H  . insulin aspart  0-5 Units Subcutaneous QHS  . insulin aspart  0-6 Units Subcutaneous TID WC  . metoprolol tartrate  5 mg Intravenous Q6H  . sodium chloride flush  3 mL Intravenous Q12H   Continuous Infusions: . sodium chloride    . diltiazem (CARDIZEM) infusion Stopped (11/28/19 1510)   PRN Meds:.sodium chloride, acetaminophen **OR** acetaminophen, albuterol, labetalol, ondansetron **OR** ondansetron (ZOFRAN) IV, polyethylene glycol, sodium chloride flush, traZODone    Anti-infectives (From admission, onward)   None        Objective:   Vitals:   11/28/19 1500 11/28/19 1624 11/28/19 1630 11/28/19 1700  BP: 104/82 100/69 100/85   Pulse: 98 (!) 103 (!) 105 95  Resp: 16 19 (!) 23 19  Temp:  97.7 F (36.5 C)    TempSrc:  Axillary    SpO2: 96% 93% 91% 94%  Weight:      Height:        Wt Readings from Last 3 Encounters:  11/28/19 (!) 141.9 kg  10/05/19 (!) 144.5 kg  08/18/19 (!) 143.8 kg     Intake/Output Summary (Last 24 hours) at 11/28/2019 1827 Last data filed at 11/28/2019 1523 Gross per 24 hour  Intake 318.78 ml  Output 450 ml  Net -131.22 ml    Physical Exam  Gen:- Awake Alert,  In no apparent  distress , speaking in sentences HEENT:- [place].AT, No sclera icterus Neck-Supple Neck, resolved JVD elevation.  Lungs-improved air movement, no wheezing CV- S1, S2 normal, regular, tachycardic Abd-  +ve B.Sounds, Abd Soft, No tenderness, increased truncal adiposity    Extremity/Skin:-Resolved pitting pedal edema, pedal pulses present  Psych-affect is appropriate, oriented x3 Neuro-no new focal deficits, no tremors   Data Review:   Micro Results Recent Results (from the past 240 hour(s))  SARS Coronavirus 2 by RT PCR (hospital order, performed in [facility] hospital lab) Nasopharyngeal Nasopharyngeal Swab     Status: None   Collection Time: 11/27/19  8:38 AM   Specimen: Nasopharyngeal Swab  Result Value Ref Range Status   SARS Coronavirus 2 NEGATIVE NEGATIVE Final  Comment: (NOTE) SARS-CoV-2 target nucleic acids are NOT DETECTED.  The SARS-CoV-2 RNA is generally detectable in upper and lower respiratory specimens during the acute phase of infection. The lowest concentration of SARS-CoV-2 viral copies this assay can detect is 250 copies / mL. A negative result does not preclude SARS-CoV-2 infection and should not be used as the sole basis for treatment or other patient management decisions.  A negative result may occur with improper specimen collection / handling, submission of specimen other than nasopharyngeal swab, presence of viral mutation(s) within the areas targeted by this assay, and inadequate number of viral copies (<250 copies / mL). A negative result must be combined with clinical observations, patient history, and epidemiological information.  Fact Sheet for Patients:   StrictlyIdeas.no  Fact Sheet for Healthcare Providers: BankingDealers.co.za  This test is not yet approved or  cleared by the [place] FDA and has been authorized for detection and/or diagnosis of SARS-CoV-2 by FDA under an Emergency Use  Authorization (EUA).  This EUA will remain in effect (meaning this test can be used) for the duration of the COVID-19 declaration under Section 564(b)(1) of the Act, 21 U.S.C. section 360bbb-3(b)(1), unless the authorization is terminated or revoked sooner.  Performed at [facility], [address], [place], [place] [address]   MRSA PCR Screening     Status: None   Collection Time: 11/27/19  8:49 PM   Specimen: Nasal Mucosa; Nasopharyngeal  Result Value Ref Range Status   MRSA by PCR NEGATIVE NEGATIVE Final    Comment:        The GeneXpert MRSA Assay (FDA approved for NASAL specimens only), is one component of a comprehensive MRSA colonization surveillance program. It is not intended to diagnose MRSA infection nor to guide or monitor treatment for MRSA infections. Performed at [facility], [address] [place], [place] [address]     Radiology Reports DG Chest [place] 1 View  Result Date: 11/27/2019 CLINICAL DATA:  Shortness of breath and dry cough EXAM: PORTABLE CHEST 1 VIEW COMPARISON:  09/03/2010 FINDINGS: Cardiac shadow is stable. Lungs are well aerated bilaterally. Increased central vascular congestion is noted with mild interstitial edema. No sizable effusion is noted. No bony abnormality is seen. IMPRESSION: Changes of mild CHF with edema. Electronically Signed   By: Inez Catalina M.D.   On: 11/27/2019 10:02   ECHOCARDIOGRAM LIMITED  Result Date: 11/28/2019    ECHOCARDIOGRAM LIMITED REPORT   Patient Name:   JRAKE RODRIQUEZ Date of Exam: 11/28/2019 Medical Rec #:  [id number]            Height:       67.0 in Accession #:    [id number]           Weight:       312.8 lb Date of Birth:  November 04, 1960            BSA:          2.446 m Patient Age:    4 years             BP:           85/71 mmHg Patient Gender: M                    HR:           110 bpm. Exam Location:  [facility] Procedure: Limited Echo, Cardiac Doppler and Limited Color Doppler Indications:    Dyspnea 786.09 / R06.00   History:  Patient has prior history of Echocardiogram examinations, most                 recent 08/30/2019. Cardiomyopathy, Arrythmias:Atrial                 Fibrillation; Risk Factors:Hypertension, Diabetes and                 Dyslipidemia. Obesity,Sleep apnea (From Hx),GERD.  Sonographer:    Alvino Chapel RCS Referring Phys: [id number] Leonore  1. Left ventricular ejection fraction, by estimation, is 40 to 45%. The left ventricle has mildly decreased function. The left ventricle demonstrates global hypokinesis. Left ventricular diastolic parameters are consistent with Grade II diastolic dysfunction (pseudonormalization).  2. Right ventricular systolic function is normal. The right ventricular size is normal. There is normal pulmonary artery systolic pressure.  3. The inferior vena cava is dilated in size with >50% respiratory variability, suggesting right atrial pressure of 8 mmHg.  4. Limited study to evaluate LV and RV function. FINDINGS  Left Ventricle: Left ventricular ejection fraction, by estimation, is 40 to 45%. The left ventricle has mildly decreased function. The left ventricle demonstrates global hypokinesis. Right Ventricle: The right ventricular size is normal. No increase in right ventricular wall thickness. Right ventricular systolic function is normal. There is normal pulmonary artery systolic pressure. The tricuspid regurgitant velocity is 2.41 m/s, and  with an assumed right atrial pressure of 8 mmHg, the estimated right ventricular systolic pressure is 89.3 mmHg. Pericardium: There is no evidence of pericardial effusion. Venous: The inferior vena cava is dilated in size with greater than 50% respiratory variability, suggesting right atrial pressure of 8 mmHg. LEFT VENTRICLE PLAX 2D LVIDd:         5.98 cm LVIDs:         4.81 cm LVOT diam:     2.00 cm LVOT Area:     3.14 cm  LV Volumes (MOD) LV vol d, MOD A2C: 137.0 ml LV vol d, MOD A4C: 148.0 ml LV vol s, MOD A2C: 77.9  ml LV vol s, MOD A4C: 85.1 ml LV SV MOD A2C:     59.1 ml LV SV MOD A4C:     148.0 ml LV SV MOD BP:      64.4 ml RIGHT VENTRICLE RV S prime:     8.70 cm/s TAPSE (M-mode): 1.8 cm MITRAL VALVE                TRICUSPID VALVE MV Area (PHT): 6.71 cm     TR Peak grad:   23.2 mmHg MV Decel Time: 113 msec     TR Vmax:        241.00 cm/s MV E velocity: 123.00 cm/s MV A velocity: 31.00 cm/s   SHUNTS MV E/A ratio:  3.97         Systemic Diam: 2.00 cm Carlyle Dolly MD Electronically signed by Carlyle Dolly MD Signature Date/Time: 11/28/2019/11:28:39 AM    Final      CBC Recent Labs  Lab 11/27/19 0833 11/28/19 0514  WBC 13.3* 11.9*  HGB 12.3* 12.7*  HCT 37.3* 38.1*  PLT 158 177  MCV 94.4 95.5  MCH 31.1 31.8  MCHC 33.0 33.3  RDW 13.4 13.4  LYMPHSABS 1.5  --   MONOABS 1.1*  --   EOSABS 0.1  --   BASOSABS 0.0  --     Chemistries  Recent Labs  Lab 11/27/19 0833 11/27/19 1030 11/28/19 0514  NA 142  --  140  K 3.4*  --  4.5  CL 104  --  103  CO2 27  --  25  GLUCOSE 119*  --  126*  BUN 15  --  23*  CREATININE 1.08  --  1.81*  CALCIUM 8.7*  --  8.7*  MG  --  1.4* 2.3  AST 27  --  18  ALT 31  --  27  ALKPHOS 61  --  62  BILITOT 1.1  --  1.5*   ------------------------------------------------------------------------------------------------------------------ No results for input(s): CHOL, HDL, LDLCALC, TRIG, CHOLHDL, LDLDIRECT in the last 72 hours.  Lab Results  Component Value Date   HGBA1C 6.3 06/22/2008   ------------------------------------------------------------------------------------------------------------------ Recent Labs    11/28/19 0514  TSH 2.054   ------------------------------------------------------------------------------------------------------------------ No results for input(s): VITAMINB12, FOLATE, FERRITIN, TIBC, IRON, RETICCTPCT in the last 72 hours.  Coagulation profile No results for input(s): INR, PROTIME in the last 168 hours.  No results for  input(s): DDIMER in the last 72 hours.  Cardiac Enzymes No results for input(s): CKMB, TROPONINI, MYOGLOBIN in the last 168 hours.  Invalid input(s): CK ------------------------------------------------------------------------------------------------------------------    Component Value Date/Time   BNP 390.0 (H) 11/27/2019 2957     Roxan Hockey M.D on 11/28/2019 at 6:27 PM  Go to [email or web address] - for contact info  [facility] - Office  [phone]

## 2019-11-28 NOTE — Progress Notes (Signed)
*  PRELIMINARY RESULTS* Echocardiogram 2D Echocardiogram has been performed.  [name] 11/28/2019, 9:32 AM

## 2019-11-28 NOTE — Care Management Obs Status (Signed)
MEDICARE OBSERVATION STATUS NOTIFICATION   Patient Details  Name: [name] MRN: [id number] Date of Birth: 02/17/1961   Medicare Observation Status Notification Given:  Yes    Tommy Medal 11/28/2019, 3:54 PM

## 2019-11-28 NOTE — Progress Notes (Signed)
Cardizem gtt stopped at 1510. Heart rate is NSR higher 90s.

## 2019-11-28 NOTE — Progress Notes (Signed)
**Note De-Identified [name] Obfuscation** EKG complete results placed in patient chart

## 2019-11-29 DIAGNOSIS — I5020 Unspecified systolic (congestive) heart failure: Secondary | ICD-10-CM

## 2019-11-29 DIAGNOSIS — I5023 Acute on chronic systolic (congestive) heart failure: Secondary | ICD-10-CM

## 2019-11-29 DIAGNOSIS — I10 Essential (primary) hypertension: Secondary | ICD-10-CM

## 2019-11-29 DIAGNOSIS — I471 Supraventricular tachycardia: Secondary | ICD-10-CM | POA: Diagnosis not present

## 2019-11-29 LAB — CBC
HCT: 37.9 % — ABNORMAL LOW (ref 39.0–52.0)
Hemoglobin: 12.4 g/dL — ABNORMAL LOW (ref 13.0–17.0)
MCH: 31.6 pg (ref 26.0–34.0)
MCHC: 32.7 g/dL (ref 30.0–36.0)
MCV: 96.7 fL (ref 80.0–100.0)
Platelets: 175 10*3/uL (ref 150–400)
RBC: 3.92 MIL/uL — ABNORMAL LOW (ref 4.22–5.81)
RDW: 13.5 % (ref 11.5–15.5)
WBC: 12.8 10*3/uL — ABNORMAL HIGH (ref 4.0–10.5)
nRBC: 0 % (ref 0.0–0.2)

## 2019-11-29 LAB — GLUCOSE, CAPILLARY
Glucose-Capillary: 117 mg/dL — ABNORMAL HIGH (ref 70–99)
Glucose-Capillary: 141 mg/dL — ABNORMAL HIGH (ref 70–99)
Glucose-Capillary: 112 mg/dL — ABNORMAL HIGH (ref 70–99)
Glucose-Capillary: 138 mg/dL — ABNORMAL HIGH (ref 70–99)

## 2019-11-29 LAB — BASIC METABOLIC PANEL
Anion gap: 12 (ref 5–15)
BUN: 33 mg/dL — ABNORMAL HIGH (ref 6–20)
CO2: 25 mmol/L (ref 22–32)
Calcium: 8.4 mg/dL — ABNORMAL LOW (ref 8.9–10.3)
Chloride: 102 mmol/L (ref 98–111)
Creatinine, Ser: 1.63 mg/dL — ABNORMAL HIGH (ref 0.61–1.24)
GFR calc Af Amer: 53 mL/min — ABNORMAL LOW (ref >60)
GFR calc non Af Amer: 46 mL/min — ABNORMAL LOW (ref >60)
Glucose, Bld: 117 mg/dL — ABNORMAL HIGH (ref 70–99)
Potassium: 4.5 mmol/L (ref 3.5–5.1)
Sodium: 139 mmol/L (ref 135–145)

## 2019-11-29 LAB — MAGNESIUM: Magnesium: 2 mg/dL (ref 1.7–2.4)

## 2019-11-29 MED ORDER — FUROSEMIDE 40 MG PO TABS
40.0000 mg | ORAL_TABLET | Freq: Every day | ORAL | Status: DC
  Administered 2019-11-29 – 2019-11-30 (×2): 40 mg via ORAL
  Filled 2019-11-29 (×2): qty 1

## 2019-11-29 MED ORDER — GUAIFENESIN-DM 100-10 MG/5ML PO SYRP
5.0000 mL | ORAL_SOLUTION | ORAL | Status: DC | PRN
  Administered 2019-11-29: 5 mL via ORAL
  Filled 2019-11-29: qty 5

## 2019-11-29 MED ORDER — LORAZEPAM 2 MG/ML IJ SOLN
1.0000 mg | Freq: Once | INTRAMUSCULAR | Status: AC
  Administered 2019-11-30: 1 mg via INTRAVENOUS
  Filled 2019-11-29: qty 1

## 2019-11-29 NOTE — Progress Notes (Signed)
Patient states he does not want to wear CPAP tonight due to coughing. Patient placed on 2L and resting comfortably. Patient and RN aware to call if he changes his mind or has any complications.

## 2019-11-29 NOTE — Progress Notes (Signed)
Patient Demographics:    [name], is a 59 y.o. male, DOB - 02/06/1961, LSL:[id number]  Admit date - 11/27/2019   Admitting Physician [name], MD  Outpatient Primary MD for the patient is [name], NP  LOS - 1   Chief Complaint  Patient presents with   Shortness of Breath        Subjective:    [name] reports he continues to have palpitations but intermittently having them    Assessment  & Plan :    Principal Problem:   Acute on chronic HFrEF (heart failure with reduced ejection fraction)-Combined Systolic and Diastolic dysfunction CHF Active Problems:   Resistant hypertension   Acute exacerbation of CHF (congestive heart failure) (HCC)   OSA (obstructive sleep apnea)   Obesities, morbid ([place])   DM2 (diabetes mellitus, type 2) ([place])   Atrial tachycardia ([place])  Brief Summary:-  59 y.o. male remarkable for morbid obesity, OSA not compliant with CPAP therapy, prior history of paroxysmal atrial fibrillation in the distant past, HLD, HTN, DM2 and chronic diastolic CHF --presented to ED with worsening shortness of breath and lower extremity edema for about a week now  -Admitted 11/27/2027 with acute combined systolic and diastolic CHF exacerbation in the setting of persistent atrial tachycardia  A/p 1)HFrEF--- presented with acute on chronic combined systolic and diastolic dysfunction CHF, echo from 11/28/2019 with EF down to 40 to 45% compared to echo from 08/30/2019 when EF 4055 to 60% -Wall motion abnormalities and grade 2 diastolic dysfunction noted -Treated with IV Lasix per cardiology team -Continue daily weights and fluid input and output monitoring -Drop in EF noted but troponins are not consistent with ACS -Discussed with cardiology service -We will hold losartan, discontinue Aldactone for now due to AKI -continue Coreg  2)Persistent Atrial  Tachycardia--narrow complex mostly regular tachycardia initially failed IV adenosine, subsequently treated with IV Cardizem and IV metoprolol -PTA patient was on Coreg we will continue this -Electrolytes were replaced -TSH 2.054, mag is 2.3, potassium is 4.5 -Echocardiogram as noted above -Cardiologist advised continuing IV Cardizem and IV metoprolol for now, if patient fails to respond significantly over the next 24 hours will consider transfer to [facility] for EP cardiology evaluation  3) obesity/OSA--- compliance with CPAP strongly advised  4)AKI----acute kidney injury --due to diuretics ---creatinine on admission=1.0,  baseline creatinine =1.0   creatinine down to 1.6 today after brief diuretic holiday, now resuming lasix per cardiology service.  ---renally adjust medications, avoid nephrotoxic agents / dehydration  / hypotension -- Holdng losartan, discontinue Aldactone for now due to AKI  5)DM2-no recent A1c, hold Metformin due to AKI, Use Novolog/Humalog Sliding scale insulin with Accu-Cheks/Fingersticks as ordered CBG (last 3)  Recent Labs    11/28/19 1639 11/28/19 2120 11/29/19 0727  GLUCAP 108* 112* 117*    6)HTN- -We will hold losartan, discontinue Aldactone for now due to AKI, continue IV Cardizem and metoprolol as above #1 -Continue Coreg and clonidine  may use IV labetalol when necessary  Every 4 hours for systolic blood pressure over 170 mmhg  Disposition/Need for in-Hospital Stay- patient unable to be discharged at this time due to --- difficulties with rate control despite IV Cardizem and IV metoprolol, worsening renal function with diuretics in  the setting of CHF requiring diuresis  Status is: Inpatient  Remains inpatient appropriate because:difficulties with rate control despite IV Cardizem and IV metoprolol, worsening renal function with diuretics in the setting of CHF requiring diuresis   Disposition: The patient is from: Home              Anticipated d/c is  to: Home              Anticipated d/c date is: 1-2 days              Patient currently is not medically stable to d/c. Barriers: Not Clinically Stable- difficulties with rate control despite IV Cardizem and IV metoprolol, IV diuresis in the setting of CHF for treating volume overload  Code Status : full code  Family Communication:  (patient is alert, awake and coherent)  Consults  :  cardio  DVT Prophylaxis  :   - Heparin - SCDs   Lab Results  Component Value Date   PLT 175 11/29/2019   Inpatient Medications  Scheduled Meds:  allopurinol  300 mg Oral Daily   aspirin EC  81 mg Oral Daily   atorvastatin  40 mg Oral Daily   carvedilol  37.5 mg Oral BID WC   Chlorhexidine Gluconate Cloth  6 each Topical Daily   cloNIDine  0.3 mg Oral BID   furosemide  40 mg Oral Daily   heparin  5,000 Units Subcutaneous Q8H   insulin aspart  0-5 Units Subcutaneous QHS   insulin aspart  0-6 Units Subcutaneous TID WC   metoprolol tartrate  5 mg Intravenous Q6H   sodium chloride flush  3 mL Intravenous Q12H   Continuous Infusions:  sodium chloride     diltiazem (CARDIZEM) infusion 5 mg/hr (11/29/19 0950)   PRN Meds:.sodium chloride, acetaminophen **OR** acetaminophen, albuterol, HYDROcodone-acetaminophen, labetalol, ondansetron **OR** ondansetron (ZOFRAN) IV, polyethylene glycol, sodium chloride flush, traZODone  Anti-infectives (From admission, onward)   None       Objective:   Vitals:   11/29/19 0725 11/29/19 0950 11/29/19 0951 11/29/19 1000  BP:  (!) 100/58  99/64  Pulse: 100 97 97 96  Resp:      Temp: 98.3 F (36.8 C)     TempSrc: Oral     SpO2: 96% 91% 91% 92%  Weight:      Height:        Wt Readings from Last 3 Encounters:  11/29/19 (!) 142.3 kg  10/05/19 (!) 144.5 kg  08/18/19 (!) 143.8 kg    Intake/Output Summary (Last 24 hours) at 11/29/2019 1053 Last data filed at 11/29/2019 7654 Gross per 24 hour  Intake 264.79 ml  Output 1300 ml  Net -1035.21 ml     Physical Exam Gen:- sitting up on bed, obese male, awake alert,  In no apparent distress, speaking in full sentences HEENT:- [place].AT, No sclera icterus Neck-Supple Neck, resolved JVD elevation.  Lungs-improved air movement, no wheezing CV- normal S1, S2, tachycardic rate Abd- normal BS, Abd Soft, No tenderness, obese    Extremity/Skin:-trace pretibial edema BLEs pedal pulses present  Psych-affect is appropriate, oriented x3 Neuro-no new focal deficits, no tremors   Data Review:   Micro Results Recent Results (from the past 240 hour(s))  SARS Coronavirus 2 by RT PCR (hospital order, performed in [facility] hospital lab) Nasopharyngeal Nasopharyngeal Swab     Status: None   Collection Time: 11/27/19  8:38 AM   Specimen: Nasopharyngeal Swab  Result Value Ref Range Status   SARS  Coronavirus 2 NEGATIVE NEGATIVE Final    Comment: (NOTE) SARS-CoV-2 target nucleic acids are NOT DETECTED.  The SARS-CoV-2 RNA is generally detectable in upper and lower respiratory specimens during the acute phase of infection. The lowest concentration of SARS-CoV-2 viral copies this assay can detect is 250 copies / mL. A negative result does not preclude SARS-CoV-2 infection and should not be used as the sole basis for treatment or other patient management decisions.  A negative result may occur with improper specimen collection / handling, submission of specimen other than nasopharyngeal swab, presence of viral mutation(s) within the areas targeted by this assay, and inadequate number of viral copies (<250 copies / mL). A negative result must be combined with clinical observations, patient history, and epidemiological information.  Fact Sheet for Patients:   StrictlyIdeas.no  Fact Sheet for Healthcare Providers: BankingDealers.co.za  This test is not yet approved or  cleared by the [place] FDA and has been authorized for detection and/or  diagnosis of SARS-CoV-2 by FDA under an Emergency Use Authorization (EUA).  This EUA will remain in effect (meaning this test can be used) for the duration of the COVID-19 declaration under Section 564(b)(1) of the Act, 21 U.S.C. section 360bbb-3(b)(1), unless the authorization is terminated or revoked sooner.  Performed at [facility], [address], [place], [place] [address]   MRSA PCR Screening     Status: None   Collection Time: 11/27/19  8:49 PM   Specimen: Nasal Mucosa; Nasopharyngeal  Result Value Ref Range Status   MRSA by PCR NEGATIVE NEGATIVE Final    Comment:        The GeneXpert MRSA Assay (FDA approved for NASAL specimens only), is one component of a comprehensive MRSA colonization surveillance program. It is not intended to diagnose MRSA infection nor to guide or monitor treatment for MRSA infections. Performed at [facility], [address] [place], [place] [address]     Radiology Reports DG Chest Bath 1 View  Result Date: 11/27/2019 CLINICAL DATA:  Shortness of breath and dry cough EXAM: PORTABLE CHEST 1 VIEW COMPARISON:  09/03/2010 FINDINGS: Cardiac shadow is stable. Lungs are well aerated bilaterally. Increased central vascular congestion is noted with mild interstitial edema. No sizable effusion is noted. No bony abnormality is seen. IMPRESSION: Changes of mild CHF with edema. Electronically Signed   By: Inez Catalina M.D.   On: 11/27/2019 10:02   ECHOCARDIOGRAM LIMITED  Result Date: 11/28/2019    ECHOCARDIOGRAM LIMITED REPORT   Patient Name:   ROSHUN KLINGENSMITH Date of Exam: 11/28/2019 Medical Rec #:  [id number]            Height:       67.0 in Accession #:    [id number]           Weight:       312.8 lb Date of Birth:  1961/02/06            BSA:          2.446 m Patient Age:    39 years             BP:           85/71 mmHg Patient Gender: M                    HR:           110 bpm. Exam [facility]:  [facility] Procedure: Limited Echo, Cardiac Doppler and  Limited Color Doppler Indications:  Dyspnea 786.09 / R06.00  History:        Patient has prior history of Echocardiogram examinations, most                 recent 08/30/2019. Cardiomyopathy, Arrythmias:Atrial                 Fibrillation; Risk Factors:Hypertension, Diabetes and                 Dyslipidemia. Obesity,Sleep apnea (From Hx),GERD.  Sonographer:    Alvino Chapel RCS Referring Phys: [id number] [place]  1. Left ventricular ejection fraction, by estimation, is 40 to 45%. The left ventricle has mildly decreased function. The left ventricle demonstrates global hypokinesis. Left ventricular diastolic parameters are consistent with Grade II diastolic dysfunction (pseudonormalization).  2. Right ventricular systolic function is normal. The right ventricular size is normal. There is normal pulmonary artery systolic pressure.  3. The inferior vena cava is dilated in size with >50% respiratory variability, suggesting right atrial pressure of 8 mmHg.  4. Limited study to evaluate LV and RV function. FINDINGS  Left Ventricle: Left ventricular ejection fraction, by estimation, is 40 to 45%. The left ventricle has mildly decreased function. The left ventricle demonstrates global hypokinesis. Right Ventricle: The right ventricular size is normal. No increase in right ventricular wall thickness. Right ventricular systolic function is normal. There is normal pulmonary artery systolic pressure. The tricuspid regurgitant velocity is 2.41 m/s, and  with an assumed right atrial pressure of 8 mmHg, the estimated right ventricular systolic pressure is 79.0 mmHg. Pericardium: There is no evidence of pericardial effusion. Venous: The inferior vena cava is dilated in size with greater than 50% respiratory variability, suggesting right atrial pressure of 8 mmHg. LEFT VENTRICLE PLAX 2D LVIDd:         5.98 cm LVIDs:         4.81 cm LVOT diam:     2.00 cm LVOT Area:     3.14 cm  LV Volumes (MOD) LV vol d, MOD A2C:  137.0 ml LV vol d, MOD A4C: 148.0 ml LV vol s, MOD A2C: 77.9 ml LV vol s, MOD A4C: 85.1 ml LV SV MOD A2C:     59.1 ml LV SV MOD A4C:     148.0 ml LV SV MOD BP:      64.4 ml RIGHT VENTRICLE RV S prime:     8.70 cm/s TAPSE (M-mode): 1.8 cm MITRAL VALVE                TRICUSPID VALVE MV Area (PHT): 6.71 cm     TR Peak grad:   23.2 mmHg MV Decel Time: 113 msec     TR Vmax:        241.00 cm/s MV E velocity: 123.00 cm/s MV A velocity: 31.00 cm/s   SHUNTS MV E/A ratio:  3.97         Systemic Diam: 2.00 cm Carlyle Dolly MD Electronically signed by Carlyle Dolly MD Signature Date/Time: 11/28/2019/11:28:39 AM    Final      CBC Recent Labs  Lab 11/27/19 2409 11/28/19 0514 11/29/19 0735  WBC 13.3* 11.9* 12.8*  HGB 12.3* 12.7* 12.4*  HCT 37.3* 38.1* 37.9*  PLT 158 177 175  MCV 94.4 95.5 96.7  MCH 31.1 31.8 31.6  MCHC 33.0 33.3 32.7  RDW 13.4 13.4 13.5  LYMPHSABS 1.5  --   --   MONOABS 1.1*  --   --   EOSABS 0.1  --   --  BASOSABS 0.0  --   --     Chemistries  Recent Labs  Lab 11/27/19 0833 11/27/19 1030 11/28/19 0514 11/29/19 0735  NA 142  --  140 139  K 3.4*  --  4.5 4.5  CL 104  --  103 102  CO2 27  --  25 25  GLUCOSE 119*  --  126* 117*  BUN 15  --  23* 33*  CREATININE 1.08  --  1.81* 1.63*  CALCIUM 8.7*  --  8.7* 8.4*  MG  --  1.4* 2.3 2.0  AST 27  --  18  --   ALT 31  --  27  --   ALKPHOS 61  --  62  --   BILITOT 1.1  --  1.5*  --    ------------------------------------------------------------------------------------------------------------------ No results for input(s): CHOL, HDL, LDLCALC, TRIG, CHOLHDL, LDLDIRECT in the last 72 hours.  Lab Results  Component Value Date   HGBA1C 6.3 06/22/2008   ------------------------------------------------------------------------------------------------------------------ Recent Labs    11/28/19 0514  TSH 2.054    ------------------------------------------------------------------------------------------------------------------ No results for input(s): VITAMINB12, FOLATE, FERRITIN, TIBC, IRON, RETICCTPCT in the last 72 hours.  Coagulation profile No results for input(s): INR, PROTIME in the last 168 hours.  No results for input(s): DDIMER in the last 72 hours.  Cardiac Enzymes No results for input(s): CKMB, TROPONINI, MYOGLOBIN in the last 168 hours.  Invalid input(s): CK ------------------------------------------------------------------------------------------------------------------    Component Value Date/Time   BNP 390.0 (H) 11/27/2019 5427   Critical Care Procedure Note Authorized and Performed by: Murvin Natal MD  Total Critical Care time:  30 mins Due to a high probability of clinically significant, life threatening deterioration, the patient required my highest level of preparedness to intervene emergently and I personally spent this critical care time directly and personally managing the patient.  This critical care time included obtaining a history; examining the patient, pulse oximetry; ordering and review of studies; arranging urgent treatment with development of a management plan; evaluation of patient's response of treatment; frequent reassessment; and discussions with other providers.  This critical care time was performed to assess and manage the high probability of imminent and life threatening deterioration that could result in multi-organ failure.  It was exclusive of separately billable procedures and treating other patients and teaching time.   Irwin Brakeman M.D on 11/29/2019 at 10:53 AM  Go to [email or web address] - for contact info How to contact the [facility] Attending or Consulting provider [place] or covering provider during after hours [place], for this patient?  1. Check the care team in [facility] and look for a) attending/consulting [facility] provider listed and b) the [facility] team listed 2. Log  into [email or web address] and use [place] universal password to access. If you do not have the password, please contact the hospital operator. 3. Locate the [facility] provider you are looking for under [facility] and page to a number that you can be directly reached. 4. If you still have difficulty reaching the provider, please page the [facility] (Director on Call) for the Hospitalists listed on amion for assistance.

## 2019-11-29 NOTE — Progress Notes (Signed)
Progress Note  Patient Name: [name] Date of Encounter: 11/29/2019  [facility] Cardiologist: Rozann Lesches, MD   Subjective   Still with palpitations and dyspnea laying flat   Inpatient Medications    Scheduled Meds: . allopurinol  300 mg Oral Daily  . aspirin EC  81 mg Oral Daily  . atorvastatin  40 mg Oral Daily  . carvedilol  37.5 mg Oral BID WC  . Chlorhexidine Gluconate Cloth  6 each Topical Daily  . cloNIDine  0.3 mg Oral BID  . heparin  5,000 Units Subcutaneous Q8H  . insulin aspart  0-5 Units Subcutaneous QHS  . insulin aspart  0-6 Units Subcutaneous TID WC  . metoprolol tartrate  5 mg Intravenous Q6H  . sodium chloride flush  3 mL Intravenous Q12H   Continuous Infusions: . sodium chloride    . diltiazem (CARDIZEM) infusion Stopped (11/28/19 1510)   PRN Meds: sodium chloride, acetaminophen **OR** acetaminophen, albuterol, HYDROcodone-acetaminophen, labetalol, ondansetron **OR** ondansetron (ZOFRAN) IV, polyethylene glycol, sodium chloride flush, traZODone   Vital Signs    Vitals:   11/29/19 0600 11/29/19 0630 11/29/19 0700 11/29/19 0725  BP: (!) 92/59 (!) 133/94 126/90   Pulse: 96 90 95 100  Resp: 18 15 (!) 25   Temp:    98.3 F (36.8 C)  TempSrc:    Oral  SpO2: 95% 95% 96% 96%  Weight:      Height:        Intake/Output Summary (Last 24 hours) at 11/29/2019 0919 Last data filed at 11/29/2019 0728 Gross per 24 hour  Intake 441.01 ml  Output 1300 ml  Net -858.99 ml   Last 3 Weights 11/29/2019 11/28/2019 11/27/2019  Weight (lbs) 313 lb 11.4 oz 312 lb 13.3 oz 312 lb 13.3 oz  Weight (kg) 142.3 kg 141.9 kg 141.9 kg      Telemetry    SInus, runs of atach - Personally Reviewed  ECG    n/a- Personally Reviewed  Physical Exam   GEN: No acute distress.   Neck: No JVD Cardiac: RRR, no murmurs, rubs, or gallops.  Respiratory: Clear to auscultation bilaterally. GI: Soft, nontender, non-distended  MS: No edema; No deformity. Neuro:   Nonfocal  Psych: Normal affect   Labs    High Sensitivity Troponin:   Recent Labs  Lab 11/27/19 0833 11/27/19 1030  TROPONINIHS 47* 45*      Chemistry Recent Labs  Lab 11/27/19 0833 11/28/19 0514 11/29/19 0735  NA 142 140 139  K 3.4* 4.5 4.5  CL 104 103 102  CO2 27 25 25   GLUCOSE 119* 126* 117*  BUN 15 23* 33*  CREATININE 1.08 1.81* 1.63*  CALCIUM 8.7* 8.7* 8.4*  PROT 6.6 6.8  --   ALBUMIN 3.6 3.4*  --   AST 27 18  --   ALT 31 27  --   ALKPHOS 61 62  --   BILITOT 1.1 1.5*  --   GFRNONAA >60 41* 46*  GFRAA >60 47* 53*  ANIONGAP 11 12 12      Hematology Recent Labs  Lab 11/27/19 0833 11/28/19 0514 11/29/19 0735  WBC 13.3* 11.9* 12.8*  RBC 3.95* 3.99* 3.92*  HGB 12.3* 12.7* 12.4*  HCT 37.3* 38.1* 37.9*  MCV 94.4 95.5 96.7  MCH 31.1 31.8 31.6  MCHC 33.0 33.3 32.7  RDW 13.4 13.4 13.5  PLT 158 177 175    BNP Recent Labs  Lab 11/27/19 0833  BNP 390.0*     DDimer No results  for input(s): DDIMER in the last 168 hours.   Radiology    DG Chest Port 1 View  Result Date: 11/27/2019 CLINICAL DATA:  Shortness of breath and dry cough EXAM: PORTABLE CHEST 1 VIEW COMPARISON:  09/03/2010 FINDINGS: Cardiac shadow is stable. Lungs are well aerated bilaterally. Increased central vascular congestion is noted with mild interstitial edema. No sizable effusion is noted. No bony abnormality is seen. IMPRESSION: Changes of mild CHF with edema. Electronically Signed   By: Inez Catalina M.D.   On: 11/27/2019 10:02   ECHOCARDIOGRAM LIMITED  Result Date: 11/28/2019    ECHOCARDIOGRAM LIMITED REPORT   Patient Name:   [name] Date of Exam: 11/28/2019 Medical Rec #:  [id number]            Height:       67.0 in Accession #:    [id number]           Weight:       312.8 lb Date of Birth:  1960-07-02            BSA:          2.446 m Patient Age:    59 years             BP:           85/71 mmHg Patient Gender: M                    HR:           110 bpm. [facility]am Location[facility]  [facility] Procedure: Limited Echo, Cardiac Doppler and Limited Color Doppler Indications:    Dyspnea 786.09 / R06.00  History:        Patient has prior history of Echocardiogram examinations, most                 recent 08/30/2019. Cardiomyopathy, Arrythmias:Atrial                 Fibrillation; Risk Factors:Hypertension, Diabetes and                 Dyslipidemia. Obesity,Sleep apnea (From Hx),GERD.  Sonographer:    Alvino Chapel RCS Referring Phys: [id number] [place]  1. Left ventricular ejection fraction, by estimation, is 40 to 45%. The left ventricle has mildly decreased function. The left ventricle demonstrates global hypokinesis. Left ventricular diastolic parameters are consistent with Grade II diastolic dysfunction (pseudonormalization).  2. Right ventricular systolic function is normal. The right ventricular size is normal. There is normal pulmonary artery systolic pressure.  3. The inferior vena cava is dilated in size with >50% respiratory variability, suggesting right atrial pressure of 8 mmHg.  4. Limited study to evaluate LV and RV function. FINDINGS  Left Ventricle: Left ventricular ejection fraction, by estimation, is 40 to 45%. The left ventricle has mildly decreased function. The left ventricle demonstrates global hypokinesis. Right Ventricle: The right ventricular size is normal. No increase in right ventricular wall thickness. Right ventricular systolic function is normal. There is normal pulmonary artery systolic pressure. The tricuspid regurgitant velocity is 2.41 m/s, and  with an assumed right atrial pressure of 8 mmHg, the estimated right ventricular systolic pressure is 18.5 mmHg. Pericardium: There is no evidence of pericardial effusion. Venous: The inferior vena cava is dilated in size with greater than 50% respiratory variability, suggesting right atrial pressure of 8 mmHg. LEFT VENTRICLE PLAX 2D LVIDd:         5.98 cm LVIDs:  4.81 cm LVOT diam:     2.00 cm LVOT Area:      3.14 cm  LV Volumes (MOD) LV vol d, MOD A2C: 137.0 ml LV vol d, MOD A4C: 148.0 ml LV vol s, MOD A2C: 77.9 ml LV vol s, MOD A4C: 85.1 ml LV SV MOD A2C:     59.1 ml LV SV MOD A4C:     148.0 ml LV SV MOD BP:      64.4 ml RIGHT VENTRICLE RV S prime:     8.70 cm/s TAPSE (M-mode): 1.8 cm MITRAL VALVE                TRICUSPID VALVE MV Area (PHT): 6.71 cm     TR Peak grad:   23.2 mmHg MV Decel Time: 113 msec     TR Vmax:        241.00 cm/s MV E velocity: 123.00 cm/s MV A velocity: 31.00 cm/s   SHUNTS MV E/A ratio:  3.97         Systemic Diam: 2.00 cm Carlyle Dolly MD Electronically signed by Carlyle Dolly MD Signature Date/Time: 11/28/2019/11:28:39 AM    Final     Cardiac Studies     Patient Profile     Mr. Schulenburg 59 yo male history of remote PAF, HTN, HL, DM2, OSA, resistant HTN, chronic diastolic HF. Presents with SOB and LE edema. Reports having a cold about 1 week ago. Never fully recovered. Started having progressive SOB and LE edema, orthopnea.    Assessment & Plan    1. Acute on chronic diastolic HF - grade 2 diastolic CHF from HTN obesity hypoventilation improving continue ARB held an aldactone d/c due to azotemia would restart some lasix today as he is still volume overloaded Output close to 900 cc Cr a bit better 1.81-> 1.63 today   2. 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 Severe OSA - tolerated bipap last night  - ongoing workup as outpatient.    For questions or updates, please contact [place] Please consult [email or web address] for contact info under        Signed, Jenkins Rouge, MD  11/29/2019, 9:19 AM

## 2019-11-30 ENCOUNTER — Inpatient Hospital Stay (HOSPITAL_COMMUNITY): Payer: Medicare HMO

## 2019-11-30 LAB — CBC
HCT: 37.6 % — ABNORMAL LOW (ref 39.0–52.0)
Hemoglobin: 11.9 g/dL — ABNORMAL LOW (ref 13.0–17.0)
MCH: 30.9 pg (ref 26.0–34.0)
MCHC: 31.6 g/dL (ref 30.0–36.0)
MCV: 97.7 fL (ref 80.0–100.0)
Platelets: 179 10*3/uL (ref 150–400)
RBC: 3.85 MIL/uL — ABNORMAL LOW (ref 4.22–5.81)
RDW: 13.1 % (ref 11.5–15.5)
WBC: 11.9 10*3/uL — ABNORMAL HIGH (ref 4.0–10.5)
nRBC: 0 % (ref 0.0–0.2)

## 2019-11-30 LAB — BASIC METABOLIC PANEL
Anion gap: 11 (ref 5–15)
BUN: 37 mg/dL — ABNORMAL HIGH (ref 6–20)
CO2: 26 mmol/L (ref 22–32)
Calcium: 8.4 mg/dL — ABNORMAL LOW (ref 8.9–10.3)
Chloride: 101 mmol/L (ref 98–111)
Creatinine, Ser: 1.51 mg/dL — ABNORMAL HIGH (ref 0.61–1.24)
GFR calc Af Amer: 59 mL/min — ABNORMAL LOW (ref >60)
GFR calc non Af Amer: 51 mL/min — ABNORMAL LOW (ref >60)
Glucose, Bld: 142 mg/dL — ABNORMAL HIGH (ref 70–99)
Potassium: 4 mmol/L (ref 3.5–5.1)
Sodium: 138 mmol/L (ref 135–145)

## 2019-11-30 LAB — GLUCOSE, CAPILLARY
Glucose-Capillary: 110 mg/dL — ABNORMAL HIGH (ref 70–99)
Glucose-Capillary: 109 mg/dL — ABNORMAL HIGH (ref 70–99)
Glucose-Capillary: 110 mg/dL — ABNORMAL HIGH (ref 70–99)

## 2019-11-30 LAB — MAGNESIUM: Magnesium: 2 mg/dL (ref 1.7–2.4)

## 2019-11-30 MED ORDER — FUROSEMIDE 40 MG PO TABS
40.0000 mg | ORAL_TABLET | Freq: Two times a day (BID) | ORAL | Status: DC
  Administered 2019-11-30 – 2019-12-01 (×2): 40 mg via ORAL
  Filled 2019-11-30 (×2): qty 1

## 2019-11-30 NOTE — Progress Notes (Signed)
Patient Demographics:    [name], is a 59 y.o. male, DOB - 1960/09/21, RUE:[id number]  Admit date - 11/27/2019   Admitting Physician Andrew Denton Brick, MD  Outpatient Primary MD for the patient is Andrew Cara, NP  LOS - 2   Chief Complaint  Patient presents with   Shortness of Breath        Subjective:    [name] reports he remains very SOB, no chest pain, still having palpitations.     Assessment  & Plan :    Principal Problem:   Acute on chronic HFrEF (heart failure with reduced ejection fraction)-Combined Systolic and Diastolic dysfunction CHF Active Problems:   Resistant hypertension   Acute exacerbation of CHF (congestive heart failure) (HCC)   OSA (obstructive sleep apnea)   Obesities, morbid (HCC)   DM2 (diabetes mellitus, type 2) (HCC)   Atrial tachycardia (HCC)   Systolic congestive heart failure ([place])  Brief Summary:-  60 y.o. male remarkable for morbid obesity, OSA not compliant with CPAP therapy, prior history of paroxysmal atrial fibrillation in the distant past, HLD, HTN, DM2 and chronic diastolic CHF --presented to ED with worsening shortness of breath and lower extremity edema for about a week now  -Admitted 11/27/2027 with acute combined systolic and diastolic CHF exacerbation in the setting of persistent atrial tachycardia  A/p 1)HFrEF--- presented with acute on chronic combined systolic and diastolic dysfunction CHF, echo from 11/28/2019 with EF down to 40 to 45% compared to echo from 08/30/2019 when EF 4055 to 60% -Wall motion abnormalities and grade 2 diastolic dysfunction noted -Treated with IV Lasix per cardiology team -Continue daily weights and fluid input and output monitoring -Drop in EF noted but troponins are not consistent with ACS -Discussed with cardiology service -We will hold losartan, discontinue Aldactone for now due to AKI -continue  Coreg  2)Persistent Atrial Tachycardia--narrow complex mostly regular tachycardia initially failed IV adenosine, subsequently treated with IV Cardizem and IV metoprolol -PTA patient was on Coreg we will continue this -Electrolytes were replaced -TSH 2.054, mag is 2.3, potassium is 4.5 -Echocardiogram as noted above -Cardiologist advised transfer to [facility] for EP cardiology evaluation  3) obesity/OSA--- compliance with CPAP strongly advised  4)AKI----acute kidney injury --due to diuretics ---creatinine on admission=1.0,  baseline creatinine =1.0   creatinine down to 1.6 today after brief diuretic holiday, now resuming lasix per cardiology service.  ---renally adjust medications, avoid nephrotoxic agents / dehydration  / hypotension -- Holdng losartan, discontinue Aldactone for now due to AKI  5)DM2-no recent A1c, hold Metformin due to AKI, Use Novolog/Humalog Sliding scale insulin with Accu-Cheks/Fingersticks as ordered CBG (last 3)  Recent Labs    11/29/19 2040 11/30/19 0743 11/30/19 1153  GLUCAP 138* 110* 109*    6)HTN- -We will hold losartan, discontinue Aldactone for now due to AKI, continue IV Cardizem and metoprolol as above #1 -Continue Coreg and clonidine  may use IV labetalol when necessary  Every 4 hours for systolic blood pressure over 170 mmhg  Disposition/Need for in-Hospital Stay- patient unable to be discharged at this time due to --- difficulties with rate control despite IV Cardizem and IV metoprolol, worsening renal function with diuretics in the setting of CHF requiring diuresis  Status is: Inpatient  Remains  inpatient appropriate because:difficulties with rate control despite IV Cardizem and IV metoprolol, worsening renal function with diuretics in the setting of CHF requiring diuresis  Disposition: The patient is from: Home              Anticipated d/c is to: Home              Anticipated d/c date is: 2 days              Patient currently is not medically  stable to d/c. Barriers: Not Clinically Stable- difficulties with rate control despite IV Cardizem and IV metoprolol, IV diuresis in the setting of CHF for treating volume overload, transfer to [facility] for EP cardiology consultation  Code Status : full code  Family Communication:  (patient is alert, awake and coherent)  Consults  :  cardio  DVT Prophylaxis  :   - Heparin - SCDs   Lab Results  Component Value Date   PLT 179 11/30/2019   Inpatient Medications  Scheduled Meds:  allopurinol  300 mg Oral Daily   aspirin EC  81 mg Oral Daily   atorvastatin  40 mg Oral Daily   carvedilol  37.5 mg Oral BID WC   Chlorhexidine Gluconate Cloth  6 each Topical Daily   cloNIDine  0.3 mg Oral BID   furosemide  40 mg Oral BID   heparin  5,000 Units Subcutaneous Q8H   insulin aspart  0-5 Units Subcutaneous QHS   insulin aspart  0-6 Units Subcutaneous TID WC   metoprolol tartrate  5 mg Intravenous Q6H   sodium chloride flush  3 mL Intravenous Q12H   Continuous Infusions:  sodium chloride     PRN Meds:.sodium chloride, acetaminophen **OR** acetaminophen, albuterol, guaiFENesin-dextromethorphan, HYDROcodone-acetaminophen, labetalol, ondansetron **OR** ondansetron (ZOFRAN) IV, polyethylene glycol, sodium chloride flush, traZODone  Anti-infectives (From admission, onward)   None       Objective:   Vitals:   11/30/19 1100 11/30/19 1130 11/30/19 1154 11/30/19 1200  BP: (!) 106/88 (!) 101/62  (!) 114/86  Pulse: 91 92 91 88  Resp: 22 18 23 21   Temp:   (!) 97.5 F (36.4 C)   TempSrc:   Oral   SpO2: 97% 95% 96% 94%  Weight:      Height:        Wt Readings from Last 3 Encounters:  11/29/19 (!) 142.3 kg  10/05/19 (!) 144.5 kg  08/18/19 (!) 143.8 kg    Intake/Output Summary (Last 24 hours) at 11/30/2019 1225 Last data filed at 11/30/2019 1100 Gross per 24 hour  Intake 240 ml  Output 1550 ml  Net -1310 ml    Physical Exam Gen:- sitting up on bed, obese male, awake  alert,  In no apparent distress, speaking in full sentences HEENT:- [place].AT, No sclera icterus Neck-Supple Neck, resolved JVD elevation.  Lungs-improved air movement, no wheezing CV- normal S1, S2, tachycardic rate Abd- normal BS, Abd Soft, No tenderness, obese    Extremity/Skin:-trace pretibial edema BLEs pedal pulses present  Psych-affect is appropriate, oriented x3 Neuro-no new focal deficits, no tremors   Data Review:   Micro Results Recent Results (from the past 240 hour(s))  SARS Coronavirus 2 by RT PCR (hospital order, performed in [facility] hospital lab) Nasopharyngeal Nasopharyngeal Swab     Status: None   Collection Time: 11/27/19  8:38 AM   Specimen: Nasopharyngeal Swab  Result Value Ref Range Status   SARS Coronavirus 2 NEGATIVE NEGATIVE Final    Comment: (NOTE)  SARS-CoV-2 target nucleic acids are NOT DETECTED.  The SARS-CoV-2 RNA is generally detectable in upper and lower respiratory specimens during the acute phase of infection. The lowest concentration of SARS-CoV-2 viral copies this assay can detect is 250 copies / mL. A negative result does not preclude SARS-CoV-2 infection and should not be used as the sole basis for treatment or other patient management decisions.  A negative result may occur with improper specimen collection / handling, submission of specimen other than nasopharyngeal swab, presence of viral mutation(s) within the areas targeted by this assay, and inadequate number of viral copies (<250 copies / mL). A negative result must be combined with clinical observations, patient history, and epidemiological information.  Fact Sheet for Patients:   StrictlyIdeas.no  Fact Sheet for Healthcare Providers: BankingDealers.co.za  This test is not yet approved or  cleared by the [place] FDA and has been authorized for detection and/or diagnosis of SARS-CoV-2 by FDA under an Emergency Use Authorization  (EUA).  This EUA will remain in effect (meaning this test can be used) for the duration of the COVID-19 declaration under Section 564(b)(1) of the Act, 21 U.S.C. section 360bbb-3(b)(1), unless the authorization is terminated or revoked sooner.  Performed at [facility], [address] [place], [place] [address]   MRSA PCR Screening     Status: None   Collection Time: 11/27/19  8:49 PM   Specimen: Nasal Mucosa; Nasopharyngeal  Result Value Ref Range Status   MRSA by PCR NEGATIVE NEGATIVE Final    Comment:        The GeneXpert MRSA Assay (FDA approved for NASAL specimens only), is one component of a comprehensive MRSA colonization surveillance program. It is not intended to diagnose MRSA infection nor to guide or monitor treatment for MRSA infections. Performed at [facility], [address] [place], [place] [address]     Radiology Reports DG CHEST PORT 1 VIEW  Result Date: 11/30/2019 CLINICAL DATA:  Shortness of breath, cough, history diabetes mellitus, hypertension, cardiomyopathy, atrial fibrillation EXAM: PORTABLE CHEST 1 VIEW COMPARISON:  Portable exam 1010 hours compared to 11/27/2019 FINDINGS: Minimal enlargement of cardiac silhouette. Mediastinal contours and pulmonary vascularity normal. Chronic accentuation of interstitial markings central peribronchial thickening. No pulmonary infiltrate, pleural effusion or pneumothorax. Osseous structures unremarkable. IMPRESSION: Minimal enlargement of cardiac silhouette. Peribronchial thickening with persistent accentuation of interstitial markings question moderate bronchitis versus minimal perihilar edema. Electronically Signed   By: Lavonia Dana M.D.   On: 11/30/2019 11:28   DG Chest Port 1 View  Result Date: 11/27/2019 CLINICAL DATA:  Shortness of breath and dry cough EXAM: PORTABLE CHEST 1 VIEW COMPARISON:  09/03/2010 FINDINGS: Cardiac shadow is stable. Lungs are well aerated bilaterally. Increased central vascular congestion is  noted with mild interstitial edema. No sizable effusion is noted. No bony abnormality is seen. IMPRESSION: Changes of mild CHF with edema. Electronically Signed   By: Inez Catalina M.D.   On: 11/27/2019 10:02   ECHOCARDIOGRAM LIMITED  Result Date: 11/28/2019    ECHOCARDIOGRAM LIMITED REPORT   Patient Name:   JOHNOTHAN BASCOMB Date of Exam: 11/28/2019 Medical Rec #:  [id number]            Height:       67.0 in Accession #:    [id number]           Weight:       312.8 lb Date of Birth:  1960/05/22            BSA:  2.446 m Patient Age:    53 years             BP:           85/71 mmHg Patient Gender: M                    HR:           110 bpm. Exam Location:  [facility] Procedure: Limited Echo, Cardiac Doppler and Limited Color Doppler Indications:    Dyspnea 786.09 / R06.00  History:        Patient has prior history of Echocardiogram examinations, most                 recent 08/30/2019. Cardiomyopathy, Arrythmias:Atrial                 Fibrillation; Risk Factors:Hypertension, Diabetes and                 Dyslipidemia. Obesity,Sleep apnea (From Hx),GERD.  Sonographer:    Alvino Chapel RCS Referring Phys: [id number] [place]  1. Left ventricular ejection fraction, by estimation, is 40 to 45%. The left ventricle has mildly decreased function. The left ventricle demonstrates global hypokinesis. Left ventricular diastolic parameters are consistent with Grade II diastolic dysfunction (pseudonormalization).  2. Right ventricular systolic function is normal. The right ventricular size is normal. There is normal pulmonary artery systolic pressure.  3. The inferior vena cava is dilated in size with >50% respiratory variability, suggesting right atrial pressure of 8 mmHg.  4. Limited study to evaluate LV and RV function. FINDINGS  Left Ventricle: Left ventricular ejection fraction, by estimation, is 40 to 45%. The left ventricle has mildly decreased function. The left ventricle demonstrates global  hypokinesis. Right Ventricle: The right ventricular size is normal. No increase in right ventricular wall thickness. Right ventricular systolic function is normal. There is normal pulmonary artery systolic pressure. The tricuspid regurgitant velocity is 2.41 m/s, and  with an assumed right atrial pressure of 8 mmHg, the estimated right ventricular systolic pressure is 35.5 mmHg. Pericardium: There is no evidence of pericardial effusion. Venous: The inferior vena cava is dilated in size with greater than 50% respiratory variability, suggesting right atrial pressure of 8 mmHg. LEFT VENTRICLE PLAX 2D LVIDd:         5.98 cm LVIDs:         4.81 cm LVOT diam:     2.00 cm LVOT Area:     3.14 cm  LV Volumes (MOD) LV vol d, MOD A2C: 137.0 ml LV vol d, MOD A4C: 148.0 ml LV vol s, MOD A2C: 77.9 ml LV vol s, MOD A4C: 85.1 ml LV SV MOD A2C:     59.1 ml LV SV MOD A4C:     148.0 ml LV SV MOD BP:      64.4 ml RIGHT VENTRICLE RV S prime:     8.70 cm/s TAPSE (M-mode): 1.8 cm MITRAL VALVE                TRICUSPID VALVE MV Area (PHT): 6.71 cm     TR Peak grad:   23.2 mmHg MV Decel Time: 113 msec     TR Vmax:        241.00 cm/s MV E velocity: 123.00 cm/s MV A velocity: 31.00 cm/s   SHUNTS MV E/A ratio:  3.97         Systemic Diam: 2.00 cm Carlyle Dolly MD Electronically signed by Carlyle Dolly  MD Signature Date/Time: 11/28/2019/11:28:39 AM    Final      CBC Recent Labs  Lab 11/27/19 0833 11/28/19 0514 11/29/19 0735 11/30/19 0508  WBC 13.3* 11.9* 12.8* 11.9*  HGB 12.3* 12.7* 12.4* 11.9*  HCT 37.3* 38.1* 37.9* 37.6*  PLT 158 177 175 179  MCV 94.4 95.5 96.7 97.7  MCH 31.1 31.8 31.6 30.9  MCHC 33.0 33.3 32.7 31.6  RDW 13.4 13.4 13.5 13.1  LYMPHSABS 1.5  --   --   --   MONOABS 1.1*  --   --   --   EOSABS 0.1  --   --   --   BASOSABS 0.0  --   --   --     Chemistries  Recent Labs  Lab 11/27/19 0833 11/27/19 1030 11/28/19 0514 11/29/19 0735 11/30/19 0508  NA 142  --  140 139 138  K 3.4*  --  4.5 4.5 4.0    CL 104  --  103 102 101  CO2 27  --  25 25 26   GLUCOSE 119*  --  126* 117* 142*  BUN 15  --  23* 33* 37*  CREATININE 1.08  --  1.81* 1.63* 1.51*  CALCIUM 8.7*  --  8.7* 8.4* 8.4*  MG  --  1.4* 2.3 2.0 2.0  AST 27  --  18  --   --   ALT 31  --  27  --   --   ALKPHOS 61  --  62  --   --   BILITOT 1.1  --  1.5*  --   --    ------------------------------------------------------------------------------------------------------------------ No results for input(s): CHOL, HDL, LDLCALC, TRIG, CHOLHDL, LDLDIRECT in the last 72 hours.  Lab Results  Component Value Date   HGBA1C 6.3 06/22/2008   ------------------------------------------------------------------------------------------------------------------ Recent Labs    11/28/19 0514  TSH 2.054   ------------------------------------------------------------------------------------------------------------------ No results for input(s): VITAMINB12, FOLATE, FERRITIN, TIBC, IRON, RETICCTPCT in the last 72 hours.  Coagulation profile No results for input(s): INR, PROTIME in the last 168 hours.  No results for input(s): DDIMER in the last 72 hours.  Cardiac Enzymes No results for input(s): CKMB, TROPONINI, MYOGLOBIN in the last 168 hours.  Invalid input(s): CK ------------------------------------------------------------------------------------------------------------------    Component Value Date/Time   BNP 390.0 (H) 11/27/2019 8588   Critical Care Procedure Note Authorized and Performed by: Murvin Natal MD  Total Critical Care time:  35 mins Due to a high probability of clinically significant, life threatening deterioration, the patient required my highest level of preparedness to intervene emergently and I personally spent this critical care time directly and personally managing the patient.  This critical care time included obtaining a history; examining the patient, pulse oximetry; ordering and review of studies; arranging urgent  treatment with development of a management plan; evaluation of patient's response of treatment; frequent reassessment; and discussions with other providers.  This critical care time was performed to assess and manage the high probability of imminent and life threatening deterioration that could result in multi-organ failure.  It was exclusive of separately billable procedures and treating other patients and teaching time.   Irwin Brakeman M.D on 11/30/2019 at 12:25 PM  Go to [email or web address] - for contact info How to contact the [facility] Attending or Consulting provider [place] or covering provider during after hours [place], for this patient?  1. Check the care team in [facility] and look for a) attending/consulting [facility] provider listed and b) the [facility] team listed 2.  Log into [email or web address] and use [place] universal password to access. If you do not have the password, please contact the hospital operator. 3. Locate the [facility] provider you are looking for under [facility] and page to a number that you can be directly reached. 4. If you still have difficulty reaching the provider, please page the [facility] (Director on Call) for the Hospitalists listed on amion for assistance.

## 2019-11-30 NOTE — Progress Notes (Addendum)
Progress Note  Patient Name: [name] Date of Encounter: 11/30/2019  [facility] Cardiologist: Rozann Lesches, MD   Subjective   Still cannot lie down 2nd SOB, sat up all night. Says abd tight when he came in, that is a little better  Inpatient Medications    Scheduled Meds: . allopurinol  300 mg Oral Daily  . aspirin EC  81 mg Oral Daily  . atorvastatin  40 mg Oral Daily  . carvedilol  37.5 mg Oral BID WC  . Chlorhexidine Gluconate Cloth  6 each Topical Daily  . cloNIDine  0.3 mg Oral BID  . furosemide  40 mg Oral Daily  . heparin  5,000 Units Subcutaneous Q8H  . insulin aspart  0-5 Units Subcutaneous QHS  . insulin aspart  0-6 Units Subcutaneous TID WC  . metoprolol tartrate  5 mg Intravenous Q6H  . sodium chloride flush  3 mL Intravenous Q12H   Continuous Infusions: . sodium chloride    . diltiazem (CARDIZEM) infusion 5 mg/hr (11/29/19 0950)   PRN Meds: sodium chloride, acetaminophen **OR** acetaminophen, albuterol, guaiFENesin-dextromethorphan, HYDROcodone-acetaminophen, labetalol, ondansetron **OR** ondansetron (ZOFRAN) IV, polyethylene glycol, sodium chloride flush, traZODone   Vital Signs    Vitals:   11/29/19 2332 11/30/19 0611 11/30/19 0745 11/30/19 1000  BP: 140/89 126/86  130/87  Pulse: (!) 105 98 92 89  Resp: 12  20 (!) 23  Temp:   98.1 F (36.7 C)   TempSrc:   Oral   SpO2: 92% 92% 95% 98%  Weight:      Height:        Intake/Output Summary (Last 24 hours) at 11/30/2019 1030 Last data filed at 11/30/2019 0600 Gross per 24 hour  Intake --  Output 1550 ml  Net -1550 ml   Last 3 Weights 11/29/2019 11/28/2019 11/27/2019  Weight (lbs) 313 lb 11.4 oz 312 lb 13.3 oz 312 lb 13.3 oz  Weight (kg) 142.3 kg 141.9 kg 141.9 kg      Telemetry    No sig ectopy today, had short runs Atach earlier  - Personally Reviewed  ECG    None today- Personally Reviewed  Physical Exam   GEN: No acute distress.   Neck: JVD not clearly seen but is  elevated Cardiac: RRR, no murmurs, rubs, or gallops.  Respiratory: decreased BS bases, deep inspiration causes cough GI: firm, nontender, non-distended  MS: R>L LE edema; No deformity. Neuro:  Nonfocal  Psych: Normal affect   Labs    High Sensitivity Troponin:   Recent Labs  Lab 11/27/19 0833 11/27/19 1030  TROPONINIHS 47* 45*      Chemistry Recent Labs  Lab 11/27/19 0833 11/27/19 0833 11/28/19 0514 11/29/19 0735 11/30/19 0508  NA 142   < > 140 139 138  K 3.4*   < > 4.5 4.5 4.0  CL 104   < > 103 102 101  CO2 27   < > 25 25 26   GLUCOSE 119*   < > 126* 117* 142*  BUN 15   < > 23* 33* 37*  CREATININE 1.08   < > 1.81* 1.63* 1.51*  CALCIUM 8.7*   < > 8.7* 8.4* 8.4*  PROT 6.6  --  6.8  --   --   ALBUMIN 3.6  --  3.4*  --   --   AST 27  --  18  --   --   ALT 31  --  27  --   --   ALKPHOS 61  --  62  --   --   BILITOT 1.1  --  1.5*  --   --   GFRNONAA >60   < > 41* 46* 51*  GFRAA >60   < > 47* 53* 59*  ANIONGAP 11   < > 12 12 11    < > = values in this interval not displayed.     Hematology Recent Labs  Lab 11/28/19 0514 11/29/19 0735 11/30/19 0508  WBC 11.9* 12.8* 11.9*  RBC 3.99* 3.92* 3.85*  HGB 12.7* 12.4* 11.9*  HCT 38.1* 37.9* 37.6*  MCV 95.5 96.7 97.7  MCH 31.8 31.6 30.9  MCHC 33.3 32.7 31.6  RDW 13.4 13.5 13.1  PLT 177 175 179    BNP Recent Labs  Lab 11/27/19 0833  BNP 390.0*    Lab Results  Component Value Date   TSH 2.054 11/28/2019   Lab Results  Component Value Date   HGBA1C 6.3 06/22/2008    Radiology    No results found.  Cardiac Studies   ECHO: 11/28/2019 1. Left ventricular ejection fraction, by estimation, is 40 to 45%. The  left ventricle has mildly decreased function. The left ventricle  demonstrates global hypokinesis. Left ventricular diastolic parameters are  consistent with Grade II diastolic  dysfunction (pseudonormalization).  2. Right ventricular systolic function is normal. The right ventricular  size is  normal. There is normal pulmonary artery systolic pressure.  3. The inferior vena cava is dilated in size with >50% respiratory  variability, suggesting right atrial pressure of 8 mmHg.  4. Limited study to evaluate LV and RV function.   Patient Profile     [name] 59 yo male history of remote PAF, HTN, HL, DM2, OSA, resistant HTN, chronic diastolic HF. Presents with SOB and LE edema. Reports having a cold about 1 week ago. Never fully recovered. Started having progressive SOB and LE edema, orthopnea.    Assessment & Plan    1. Acute on chronic diastolic HF - I/O net -2.4 L but intake is incomplete, wt down 5 lbs - got Lasix 40 mg IV on 07/19 and 20 mg IV on 07/20 - Lasix 40 mg po qd started 07/21 - grade 2 dd, felt 2nd HTN, OHS - REPEAT CXR PENDING - pta on losartan 100 mg and aldactone 25 mg qd>>both held due to increased BUN/Cr - still w/ volume overload, discuss options w/ MD  2. 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 Severe OSA - tried on CPAP, but did not wear last pm due to coughing.   4. ARI - BUN/Cr 15/1.08 on admit>> 23/1.81 >> 33/1.63 >> 37/1.51 - BUN still trending up, Cr now trending down - off ARB/Aldactone   For questions or updates, please contact [place] Please consult [email or web address] for contact info under        Signed, Rosaria Ferries, PA-C  11/30/2019, 10:30 AM    Attending note Patient seen and discussed with PA Barrett, I agree with her documentation. Admitted with volume overload. History of diasotlic HF, echo this admit shows new mild decreased in LVEF to 40-45%. His I/Os are incomplete, he had very significant diuresis in the ER that was not measured. Weight  indicate down 2 kg's since admission. Diuresis limited by significant AKI that is resolving. Started on oral lasix 40mg  daily, Cr trending down, tolerating better than prior IV lasix. His home ARB and aldactone are on hold given his AKI. Tachycardia this admission. From Dr Kyla Balzarine note he reviewed EKGs with Dr Caryl Comes, long RP tach and potential AVNRT as well. Tachy was quite aggresisve initially requiring IV dilt gtt and IV lopressor. Currently on coreg 37.5mg  bid, remains on IV lopressor that we will d/c today. I think his tachycardia is mediating his HF exacerbation and likely his mild LV systolic dysfunction, and limiting our ability to signifiantly diurese him. Would plan for inpatient EP evaluation, will discuss with primary team a transfer to [facility]. Increase lasix to oral 40mg  bid today   Carlyle Dolly MD

## 2019-12-01 DIAGNOSIS — N179 Acute kidney failure, unspecified: Secondary | ICD-10-CM

## 2019-12-01 LAB — GLUCOSE, CAPILLARY
Glucose-Capillary: 132 mg/dL — ABNORMAL HIGH (ref 70–99)
Glucose-Capillary: 117 mg/dL — ABNORMAL HIGH (ref 70–99)
Glucose-Capillary: 121 mg/dL — ABNORMAL HIGH (ref 70–99)
Glucose-Capillary: 121 mg/dL — ABNORMAL HIGH (ref 70–99)
Glucose-Capillary: 121 mg/dL — ABNORMAL HIGH (ref 70–99)

## 2019-12-01 LAB — BASIC METABOLIC PANEL
Anion gap: 11 (ref 5–15)
BUN: 27 mg/dL — ABNORMAL HIGH (ref 6–20)
CO2: 26 mmol/L (ref 22–32)
Calcium: 8.9 mg/dL (ref 8.9–10.3)
Chloride: 104 mmol/L (ref 98–111)
Creatinine, Ser: 1.22 mg/dL (ref 0.61–1.24)
GFR calc Af Amer: >60 mL/min (ref >60)
GFR calc non Af Amer: >60 mL/min (ref >60)
Glucose, Bld: 114 mg/dL — ABNORMAL HIGH (ref 70–99)
Potassium: 3.8 mmol/L (ref 3.5–5.1)
Sodium: 141 mmol/L (ref 135–145)

## 2019-12-01 LAB — CBC
HCT: 38.3 % — ABNORMAL LOW (ref 39.0–52.0)
Hemoglobin: 12.5 g/dL — ABNORMAL LOW (ref 13.0–17.0)
MCH: 30.6 pg (ref 26.0–34.0)
MCHC: 32.6 g/dL (ref 30.0–36.0)
MCV: 93.9 fL (ref 80.0–100.0)
Platelets: 185 10*3/uL (ref 150–400)
RBC: 4.08 MIL/uL — ABNORMAL LOW (ref 4.22–5.81)
RDW: 13.1 % (ref 11.5–15.5)
WBC: 10.8 10*3/uL — ABNORMAL HIGH (ref 4.0–10.5)
nRBC: 0 % (ref 0.0–0.2)

## 2019-12-01 LAB — MAGNESIUM: Magnesium: 1.7 mg/dL (ref 1.7–2.4)

## 2019-12-01 MED ORDER — DOCUSATE SODIUM 100 MG PO CAPS: 100.0000 mg | ORAL_CAPSULE | Freq: Every day | ORAL | Status: DC | PRN

## 2019-12-01 MED ORDER — FUROSEMIDE 10 MG/ML IJ SOLN
40.0000 mg | Freq: Two times a day (BID) | INTRAMUSCULAR | Status: DC
  Administered 2019-12-01 – 2019-12-02 (×2): 40 mg via INTRAVENOUS
  Filled 2019-12-01 (×2): qty 4

## 2019-12-01 MED ORDER — RIVAROXABAN 10 MG PO TABS
10.0000 mg | ORAL_TABLET | Freq: Every day | ORAL | Status: DC
  Administered 2019-12-01 – 2019-12-02 (×2): 10 mg via ORAL
  Filled 2019-12-01 (×2): qty 1

## 2019-12-01 NOTE — Consult Note (Addendum)
ELECTROPHYSIOLOGY CONSULT NOTE    Patient ID: [name] MRN: [id number], DOB/AGE: 06-16-1960 59 y.o.  Admit date: 11/27/2019 Date of Consult: 12/01/2019  Primary Physician: [name], NP Primary Cardiologist: Rozann Lesches, MD  Electrophysiologist:  New.    Referring Provider: Dr. Harl Bowie  Patient Profile: [name] is a 59 y.o. male with a history of remote PAF, HTN, HLD, DM2, OSA, resistant HTN, and chronic diastolic CHF who is being seen today for the evaluation of SVT at the request of Dr Harl Bowie.  HPI:  [name] is a 59 y.o. male with medical history as above.   Pt presented to [place] with SOB, peripheral edema, and orthopnea.  Found to be in SVT in ED requiring adenosine, IV lopressor and IV diltiazem.  He has since been transitioned to po BB.   Volume overload has been difficult due to AKI.   He has been feeling poorly for approx 1 month. He has had fatigue and orthopnea. He feels his heart race a couple of times a day. He denies chest pain. He has sleep apnea, but has been unable to tolerate a CPAP mask. He has follow up scheduled. He feels better currently, but remains orthopneic and feeling "full in his stomach".   Past Medical History:  Diagnosis Date   Cardiomyopathy [name] - [place])    Essential hypertension    GERD (gastroesophageal reflux disease)    Gout    Mixed hyperlipidemia    Paroxysmal atrial fibrillation (HCC)    Sleep apnea    Type 2 diabetes mellitus ([place])      Surgical History:  Past Surgical History:  Procedure Laterality Date   BUNIONECTOMY     COLONOSCOPY WITH PROPOFOL N/A 12/06/2017   Procedure: COLONOSCOPY WITH PROPOFOL;  Surgeon: Andrew Bellows, MD;  Location: [name] ENDOSCOPY;  Service: Gastroenterology;  Laterality: N/A;     Medications Prior to Admission  Medication Sig Dispense Refill Last Dose   allopurinol (ZYLOPRIM) 300 MG tablet Take 1 tablet (300 mg total) by mouth daily. 30 tablet 5 11/27/2019 at  Unknown time   amLODipine (NORVASC) 10 MG tablet Take 10 mg by mouth daily.   11/27/2019 at Unknown time   aspirin EC 81 MG tablet Take 81 mg by mouth daily.   11/27/2019 at 0700   atorvastatin (LIPITOR) 40 MG tablet Take 40 mg by mouth daily.   11/26/2019 at Unknown time   carvedilol (COREG) 25 MG tablet Take 1.5 tablets (37.5 mg total) by mouth 2 (two) times daily with a meal. 270 tablet 3 11/27/2019 at 0700   cloNIDine (CATAPRES) 0.3 MG tablet Take 0.3 mg by mouth 2 (two) times daily.    11/27/2019 at Unknown time   losartan (COZAAR) 100 MG tablet Take 100 mg by mouth daily.   11/27/2019 at Unknown time   metFORMIN (GLUCOPHAGE) 1000 MG tablet Take 1,000 mg by mouth 2 (two) times daily.   11/27/2019 at Unknown time   spironolactone (ALDACTONE) 25 MG tablet Take 25 mg by mouth daily.   11/27/2019 at Unknown time    Inpatient Medications:   allopurinol  300 mg Oral Daily   aspirin EC  81 mg Oral Daily   atorvastatin  40 mg Oral Daily   carvedilol  37.5 mg Oral BID WC   Chlorhexidine Gluconate Cloth  6 each Topical Daily   cloNIDine  0.3 mg Oral BID   furosemide  40 mg Oral BID   heparin  5,000 Units Subcutaneous Q8H   insulin  aspart  0-5 Units Subcutaneous QHS   insulin aspart  0-6 Units Subcutaneous TID WC   sodium chloride flush  3 mL Intravenous Q12H    Allergies: No Known Allergies  Social History   Socioeconomic History   Marital status: Married    Spouse name: Not on file   Number of children: Not on file   Years of education: Not on file   Highest education level: Not on file  Occupational History   Not on file  Tobacco Use   Smoking status: Never Smoker   Smokeless tobacco: Current User    Types: Chew  Vaping Use   Vaping Use: Never used  Substance and Sexual Activity   Alcohol use: Yes    Alcohol/week: 21.0 standard drinks    Types: 21 Cans of beer per week    Comment: 2-3 beer dailys   Drug use: Never   Sexual activity: Not on file  Other Topics Concern   Not on  file  Social History Narrative   Not on file   Social Determinants of Health   Financial Resource Strain:    Difficulty of Paying Living Expenses:   Food Insecurity:    Worried About Charity fundraiser in the Last Year:    Arboriculturist in the Last Year:   Transportation Needs:    Film/video editor (Medical):    Lack of Transportation (Non-Medical):   Physical Activity:    Days of Exercise per Week:    Minutes of Exercise per Session:   Stress:    Feeling of Stress :   Social Connections:    Frequency of Communication with Friends and Family:    Frequency of Social Gatherings with Friends and Family:    Attends Religious Services:    Active Member of Clubs or Organizations:    Attends Music therapist:    Marital Status:   Intimate Partner Violence:    Fear of Current or Ex-Partner:    Emotionally Abused:    Physically Abused:    Sexually Abused:      Family History  Problem Relation Age of Onset   Hypertension Mother    Diabetes Mother    Heart disease Father    Hypertension Father    Hypertension Sister    Hypertension Brother    Diabetes Brother      Review of Systems: All other systems reviewed and are otherwise negative except as noted above.  Physical Exam: Vitals:   12/01/19 0024 12/01/19 0509 12/01/19 0743 12/01/19 0836  BP: (!) 139/96 (!) 128/86 (!) 148/99 (!) 141/89  Pulse: 91 82 86 88  Resp: 17 17 18    Temp: 98.1 F (36.7 C) 98.2 F (36.8 C) 98.8 F (37.1 C)   TempSrc: Oral Oral Oral   SpO2: 99% 97% 95% 97%  Weight:      Height:        GEN- The patient is well appearing, alert and oriented x 3 today.   HEENT: normocephalic, atraumatic; sclera clear, conjunctiva pink; hearing intact; oropharynx clear; neck supple Lungs- Clear to ausculation bilaterally, normal work of breathing.  No wheezes, rales, rhonchi Heart- Regular rate and rhythm, no murmurs, rubs or gallops GI- soft, non-tender, non-distended, bowel sounds  present Extremities- no clubbing, cyanosis, or edema; DP/PT/radial pulses 2+ bilaterally MS- no significant deformity or atrophy Skin- warm and dry, no rash or lesion Psych- euthymic mood, full affect Neuro- strength and sensation are intact  Labs:   Lab  Results  Component Value Date   WBC 10.8 (H) 12/01/2019   HGB 12.5 (L) 12/01/2019   HCT 38.3 (L) 12/01/2019   MCV 93.9 12/01/2019   PLT 185 12/01/2019    Recent Labs  Lab 11/28/19 0514 11/29/19 0735 11/30/19 0508  NA 140   < > 138  K 4.5   < > 4.0  CL 103   < > 101  CO2 25   < > 26  BUN 23*   < > 37*  CREATININE 1.81*   < > 1.51*  CALCIUM 8.7*   < > 8.4*  PROT 6.8  --   --   BILITOT 1.5*  --   --   ALKPHOS 62  --   --   ALT 27  --   --   AST 18  --   --   GLUCOSE 126*   < > 142*   < > = values in this interval not displayed.      Radiology/Studies: DG CHEST PORT 1 VIEW  Result Date: 11/30/2019 CLINICAL DATA:  Shortness of breath, cough, history diabetes mellitus, hypertension, cardiomyopathy, atrial fibrillation EXAM: PORTABLE CHEST 1 VIEW COMPARISON:  Portable exam 1010 hours compared to 11/27/2019 FINDINGS: Minimal enlargement of cardiac silhouette. Mediastinal contours and pulmonary vascularity normal. Chronic accentuation of interstitial markings central peribronchial thickening. No pulmonary infiltrate, pleural effusion or pneumothorax. Osseous structures unremarkable. IMPRESSION: Minimal enlargement of cardiac silhouette. Peribronchial thickening with persistent accentuation of interstitial markings question moderate bronchitis versus minimal perihilar edema. Electronically Signed   By: Lavonia Dana M.D.   On: 11/30/2019 11:28   DG Chest Port 1 View  Result Date: 11/27/2019 CLINICAL DATA:  Shortness of breath and dry cough EXAM: PORTABLE CHEST 1 VIEW COMPARISON:  09/03/2010 FINDINGS: Cardiac shadow is stable. Lungs are well aerated bilaterally. Increased central vascular congestion is noted with mild interstitial  edema. No sizable effusion is noted. No bony abnormality is seen. IMPRESSION: Changes of mild CHF with edema. Electronically Signed   By: Inez Catalina M.D.   On: 11/27/2019 10:02   ECHOCARDIOGRAM LIMITED  Result Date: 11/28/2019    ECHOCARDIOGRAM LIMITED REPORT   Patient Name:   DEVYNN SCHEFF Date of Exam: 11/28/2019 Medical Rec #:  [id number]            Height:       67.0 in Accession #:    [id number]           Weight:       312.8 lb Date of Birth:  14-Oct-1960            BSA:          2.446 m Patient Age:    35 years             BP:           85/71 mmHg Patient Gender: M                    HR:           110 bpm. Exam Location:  [facility] Procedure: Limited Echo, Cardiac Doppler and Limited Color Doppler Indications:    Dyspnea 786.09 / R06.00  History:        Patient has prior history of Echocardiogram examinations, most                 recent 08/30/2019. Cardiomyopathy, Arrythmias:Atrial  Fibrillation; Risk Factors:Hypertension, Diabetes and                 Dyslipidemia. Obesity,Sleep apnea (From Hx),GERD.  Sonographer:    Alvino Chapel RCS Referring Phys: [id number] [place]  1. Left ventricular ejection fraction, by estimation, is 40 to 45%. The left ventricle has mildly decreased function. The left ventricle demonstrates global hypokinesis. Left ventricular diastolic parameters are consistent with Grade II diastolic dysfunction (pseudonormalization).  2. Right ventricular systolic function is normal. The right ventricular size is normal. There is normal pulmonary artery systolic pressure.  3. The inferior vena cava is dilated in size with >50% respiratory variability, suggesting right atrial pressure of 8 mmHg.  4. Limited study to evaluate LV and RV function. FINDINGS  Left Ventricle: Left ventricular ejection fraction, by estimation, is 40 to 45%. The left ventricle has mildly decreased function. The left ventricle demonstrates global hypokinesis. Right Ventricle: The  right ventricular size is normal. No increase in right ventricular wall thickness. Right ventricular systolic function is normal. There is normal pulmonary artery systolic pressure. The tricuspid regurgitant velocity is 2.41 m/s, and  with an assumed right atrial pressure of 8 mmHg, the estimated right ventricular systolic pressure is 17.6 mmHg. Pericardium: There is no evidence of pericardial effusion. Venous: The inferior vena cava is dilated in size with greater than 50% respiratory variability, suggesting right atrial pressure of 8 mmHg. LEFT VENTRICLE PLAX 2D LVIDd:         5.98 cm LVIDs:         4.81 cm LVOT diam:     2.00 cm LVOT Area:     3.14 cm  LV Volumes (MOD) LV vol d, MOD A2C: 137.0 ml LV vol d, MOD A4C: 148.0 ml LV vol s, MOD A2C: 77.9 ml LV vol s, MOD A4C: 85.1 ml LV SV MOD A2C:     59.1 ml LV SV MOD A4C:     148.0 ml LV SV MOD BP:      64.4 ml RIGHT VENTRICLE RV S prime:     8.70 cm/s TAPSE (M-mode): 1.8 cm MITRAL VALVE                TRICUSPID VALVE MV Area (PHT): 6.71 cm     TR Peak grad:   23.2 mmHg MV Decel Time: 113 msec     TR Vmax:        241.00 cm/s MV E velocity: 123.00 cm/s MV A velocity: 31.00 cm/s   SHUNTS MV E/A ratio:  3.97         Systemic Diam: 2.00 cm Carlyle Dolly MD Electronically signed by Carlyle Dolly MD Signature Date/Time: 11/28/2019/11:28:39 AM    Final    EKG: on admission showed SVT at 158 bpm. (personally reviewed) Baseline EKG 08/18/2019 shows NSR at 83 bpm, PR 174 ms, QRS 86 ms  TELEMETRY: currently shows NSR/sinus tach 80s-110s at max (personally reviewed)  Assessment/Plan: 1.  SVT / ? Long RP tachycardia Continue beta blocker. Can continue to titrate as tolerated As previous, ablation may be beneficial long term. He has outpatient follow up arranged.  As long as HR stays stable, can follow up with Dr. Lovena Le as scheduled in [place].   2. Acute on chronic diastolic CHF He has been transition to po diuretics, but then they were increased with  continued volume overload.  Limited echo 11/28/19 shows EF 40-45% (compared to 55-60% 08/30/19) He remains volume overload and his Creatinine has mildly improved. Would  resume IV lasix as tolerated.   3. Severe OSA He has not tolerated CPAP Body mass index is 49.43 kg/m.  Encouraged lifestyle modifications  4. AKI With diuresis. Per primary.  ARB/Aldactone held.  5. Resistant HTN Continue to titrate meds as tolerated  For questions or updates, please contact [place] Please consult [email or web address] for contact info under Cardiology/STEMI.  Signed, Shirley Friar, PA-C  12/01/2019 10:08 AM  I have seen and examined this patient with Oda Kilts.  Agree with above, note added to reflect my findings.  On exam, RRR, no murmurs, lungs clear.  Patient minute with a heart failure exacerbation.  He was also found to have a long RP tachycardia.  He is currently on carvedilol 37.5 mg.  Since he has been in the hospital here, after transfer from [name], he has had no further episodes of tachycardia.  He has been up and walking.  He does continue to have shortness of breath when he lays flat.  He has significant lower extremity edema.  We Robbye Dede continue with diuresis until he can lay flat.  We Demeco Ducksworth try to hold off on inpatient ablation for his SVT.  We Alekai Pocock have him follow-up at his regularly scheduled appointment with Dr. Lovena Le in [place].  Cyara Devoto M. [place] [facility] MD 12/01/2019 10:49 AM

## 2019-12-01 NOTE — Progress Notes (Signed)
Report given to Darryl with [facility] and accepting RN, Danae Chen at [facility]. Patients transferred in stable condition.

## 2019-12-01 NOTE — Care Management Important Message (Signed)
Important Message  Patient Details  Name: JASSON SIEGMANN MRN: [id number] Date of Birth: [birth year]   Medicare Important Message Given:  Yes     Shelda Altes 12/01/2019, 8:14 AM

## 2019-12-01 NOTE — Progress Notes (Signed)
Pt ambulated in the hallway at room air.  Pt denies SOB, chest pain or dizziness.  HR on 111-120 while ambulating and pt dessated to 90%.. Pt is currently sitting in chair at room air at 94% oxygen saturation and HR 96.

## 2019-12-01 NOTE — Progress Notes (Signed)
PROGRESS NOTE    [name]  RXV:[id number] DOB: 1960-10-20 DOA: 11/27/2019 PCP: [name], NP   Brief Narrative:  59 y.o.maleremarkable for morbid obesity, OSA not compliant with CPAP therapy, prior history of paroxysmal atrial fibrillation in the distant past, HLD, HTN, DM2 and chronic diastolic CHF presented to ED with worsening shortness of breath and lower extremity edema for about a week now. Admitted 11/27/2027 with acute combined systolic and diastolic CHF exacerbation in the setting of persistent atrial tachycardia   Assessment & Plan:   Principal Problem:   Acute on chronic HFrEF (heart failure with reduced ejection fraction)-Combined Systolic and Diastolic dysfunction CHF Active Problems:   Resistant hypertension   Acute exacerbation of CHF (congestive heart failure) (HCC)   OSA (obstructive sleep apnea)   Obesities, morbid (HCC)   DM2 (diabetes mellitus, type 2) (HCC)   Atrial tachycardia (HCC)   Systolic congestive heart failure (HCC)  Acute respiratory distress Acute congestive heart failure with reduced ejection fraction, 40%; Class III -Echocardiogram 11/28/2019-EF 40-45%, grade 2 DD -Continue diuretics-change to Lasix 40 mg IV twice daily -Holding losartan.  Aldactone discontinued -Continue Coreg -Cardiology following -Daily weight, fluid restriction.  Monitor input and output -Daily aspirin  Persistent atrial tachycardia with narrow complex -Failed IV adenosine -Seen by EP-no further plans. -Coreg 37.5 mg twice daily  Obesity/OSA -CPAP  Acute kidney injury -Baseline creatinine 1.0. -Losartan and Aldactone on hold  Diabetes mellitus type 2 -Metformin on hold -Insulin sliding scale and Accu-Chek  Essential hypertension -Continue Coreg and clonidine -Losartan and Aldactone on hold   DVT prophylaxis: Subcu heparin Code Status:  Family Communication:    Status is: Inpatient  Remains inpatient appropriate because:IV treatments  appropriate due to intensity of illness or inability to take PO   Dispo: The patient is from: Home              Anticipated d/c is to: Home              Anticipated d/c date is: 1 day              Patient currently is not medically stable to d/c.  Maintain hospital stay for IV diuretics       Body mass index is 49.43 kg/m.      Subjective: Still having some orthopnea and coughing.  Unable to consistently lay flat due to shortness of breath.  Review of Systems Otherwise negative except as per HPI, including: General: Denies fever, chills, night sweats or unintended weight loss. Resp: Denies hemoptysis Cardiac: Denies chest pain, palpitations, orthopnea, paroxysmal nocturnal dyspnea. GI: Denies abdominal pain, nausea, vomiting, diarrhea or constipation GU: Denies dysuria, frequency, hesitancy or incontinence MS: Denies muscle aches, joint pain or swelling Neuro: Denies headache, neurologic deficits (focal weakness, numbness, tingling), abnormal gait Psych: Denies anxiety, depression, SI/HI/AVH Skin: Denies new rashes or lesions ID: Denies sick contacts, exotic exposures, travel  Examination:  Constitutional: Not in acute distress Respiratory: Bibasilar breath Cardiovascular: Normal sinus rhythm, no rubs Abdomen: Nontender nondistended good bowel sounds Musculoskeletal: No edema noted Skin: No rashes seen Neurologic: CN 2-12 grossly intact.  And nonfocal Psychiatric: Normal judgment and insight. Alert and oriented x 3. Normal mood.   Objective: Vitals:   12/01/19 0022 12/01/19 0024 12/01/19 0509 12/01/19 0743  BP:  (!) 139/96 (!) 128/86 (!) 148/99  Pulse:  91 82 86  Resp:  17 17 18   Temp:  98.1 F (36.7 C) 98.2 F (36.8 C) 98.8 F (37.1 C)  TempSrc:  Oral Oral Oral  SpO2:  99% 97% 95%  Weight: (!) 143.2 kg     Height: 5\' 7"  (1.702 m)       Intake/Output Summary (Last 24 hours) at 12/01/2019 0819 Last data filed at 12/01/2019 6283 Gross per 24 hour  Intake  360 ml  Output 1850 ml  Net -1490 ml   Filed Weights   11/28/19 0500 11/29/19 0500 12/01/19 0022  Weight: (!) 141.9 kg (!) 142.3 kg (!) 143.2 kg     Data Reviewed:   CBC: Recent Labs  Lab 11/27/19 0833 11/28/19 0514 11/29/19 0735 11/30/19 0508  WBC 13.3* 11.9* 12.8* 11.9*  NEUTROABS 10.5*  --   --   --   HGB 12.3* 12.7* 12.4* 11.9*  HCT 37.3* 38.1* 37.9* 37.6*  MCV 94.4 95.5 96.7 97.7  PLT 158 177 175 151   Basic Metabolic Panel: Recent Labs  Lab 11/27/19 0833 11/27/19 1030 11/28/19 0514 11/29/19 0735 11/30/19 0508  NA 142  --  140 139 138  K 3.4*  --  4.5 4.5 4.0  CL 104  --  103 102 101  CO2 27  --  25 25 26   GLUCOSE 119*  --  126* 117* 142*  BUN 15  --  23* 33* 37*  CREATININE 1.08  --  1.81* 1.63* 1.51*  CALCIUM 8.7*  --  8.7* 8.4* 8.4*  MG  --  1.4* 2.3 2.0 2.0   GFR: Estimated Creatinine Clearance: 74 mL/min (A) (by C-G formula based on SCr of 1.51 mg/dL (H)). Liver Function Tests: Recent Labs  Lab 11/27/19 0833 11/28/19 0514  AST 27 18  ALT 31 27  ALKPHOS 61 62  BILITOT 1.1 1.5*  PROT 6.6 6.8  ALBUMIN 3.6 3.4*   No results for input(s): LIPASE, AMYLASE in the last 168 hours. No results for input(s): AMMONIA in the last 168 hours. Coagulation Profile: No results for input(s): INR, PROTIME in the last 168 hours. Cardiac Enzymes: No results for input(s): CKTOTAL, CKMB, CKMBINDEX, TROPONINI in the last 168 hours. BNP (last 3 results) No results for input(s): PROBNP in the last 8760 hours. HbA1C: No results for input(s): HGBA1C in the last 72 hours. CBG: Recent Labs  Lab 11/29/19 2040 11/30/19 0743 11/30/19 1153 11/30/19 1618 12/01/19 0601  GLUCAP 138* 110* 109* 110* 117*   Lipid Profile: No results for input(s): CHOL, HDL, LDLCALC, TRIG, CHOLHDL, LDLDIRECT in the last 72 hours. Thyroid Function Tests: No results for input(s): TSH, T4TOTAL, FREET4, T3FREE, THYROIDAB in the last 72 hours. Anemia Panel: No results for input(s):  VITAMINB12, FOLATE, FERRITIN, TIBC, IRON, RETICCTPCT in the last 72 hours. Sepsis Labs: No results for input(s): PROCALCITON, LATICACIDVEN in the last 168 hours.  Recent Results (from the past 240 hour(s))  SARS Coronavirus 2 by RT PCR (hospital order, performed in [facility] hospital lab) Nasopharyngeal Nasopharyngeal Swab     Status: None   Collection Time: 11/27/19  8:38 AM   Specimen: Nasopharyngeal Swab  Result Value Ref Range Status   SARS Coronavirus 2 NEGATIVE NEGATIVE Final    Comment: (NOTE) SARS-CoV-2 target nucleic acids are NOT DETECTED.  The SARS-CoV-2 RNA is generally detectable in upper and lower respiratory specimens during the acute phase of infection. The lowest concentration of SARS-CoV-2 viral copies this assay can detect is 250 copies / mL. A negative result does not preclude SARS-CoV-2 infection and should not be used as the sole basis for treatment or other patient management decisions.  A negative result may  occur with improper specimen collection / handling, submission of specimen other than nasopharyngeal swab, presence of viral mutation(s) within the areas targeted by this assay, and inadequate number of viral copies (<250 copies / mL). A negative result must be combined with clinical observations, patient history, and epidemiological information.  Fact Sheet for Patients:   StrictlyIdeas.no  Fact Sheet for Healthcare Providers: BankingDealers.co.za  This test is not yet approved or  cleared by the [place] FDA and has been authorized for detection and/or diagnosis of SARS-CoV-2 by FDA under an Emergency Use Authorization (EUA).  This EUA will remain in effect (meaning this test can be used) for the duration of the COVID-19 declaration under Section 564(b)(1) of the Act, 21 U.S.C. section 360bbb-3(b)(1), unless the authorization is terminated or revoked sooner.  Performed at [facility], [address], [place], [place] [address]   MRSA PCR Screening     Status: None   Collection Time: 11/27/19  8:49 PM   Specimen: Nasal Mucosa; Nasopharyngeal  Result Value Ref Range Status   MRSA by PCR NEGATIVE NEGATIVE Final    Comment:        The GeneXpert MRSA Assay (FDA approved for NASAL specimens only), is one component of a comprehensive MRSA colonization surveillance program. It is not intended to diagnose MRSA infection nor to guide or monitor treatment for MRSA infections. Performed at [facility], [address] [place], [place] [address]          Radiology Studies: DG CHEST PORT 1 VIEW  Result Date: 11/30/2019 CLINICAL DATA:  Shortness of breath, cough, history diabetes mellitus, hypertension, cardiomyopathy, atrial fibrillation EXAM: PORTABLE CHEST 1 VIEW COMPARISON:  Portable exam 1010 hours compared to 11/27/2019 FINDINGS: Minimal enlargement of cardiac silhouette. Mediastinal contours and pulmonary vascularity normal. Chronic accentuation of interstitial markings central peribronchial thickening. No pulmonary infiltrate, pleural effusion or pneumothorax. Osseous structures unremarkable. IMPRESSION: Minimal enlargement of cardiac silhouette. Peribronchial thickening with persistent accentuation of interstitial markings question moderate bronchitis versus minimal perihilar edema. Electronically Signed   By: Lavonia Dana M.D.   On: 11/30/2019 11:28        Scheduled Meds: . allopurinol  300 mg Oral Daily  . aspirin EC  81 mg Oral Daily  . atorvastatin  40 mg Oral Daily  . carvedilol  37.5 mg Oral BID WC  . Chlorhexidine Gluconate Cloth  6 each Topical Daily  . cloNIDine  0.3 mg Oral BID  . furosemide  40 mg Oral BID  . heparin  5,000 Units Subcutaneous Q8H  . insulin aspart  0-5 Units Subcutaneous QHS  . insulin aspart  0-6 Units Subcutaneous TID WC  . sodium chloride flush  3 mL Intravenous Q12H   Continuous Infusions: . sodium chloride       LOS: 3 days    Time spent= 35 mins    Janitza Revuelta Arsenio Loader, MD [facility]  If 7PM-7AM, please contact night-coverage  12/01/2019, 8:19 AM

## 2019-12-01 NOTE — Progress Notes (Signed)
Patient stated he does not use a Bipap at home.  Not interested in using here.  No distress noted.  Explained to patient that if he changes his mind he can have RN call me.

## 2019-12-01 NOTE — Progress Notes (Signed)
SATURATION QUALIFICATIONS: (This note is used to comply with regulatory documentation for home oxygen)  Patient Saturations on Room Air at Rest = 97%  Patient Saturations on Room Air while Ambulating = 90%  

## 2019-12-02 LAB — BASIC METABOLIC PANEL
Anion gap: 11 (ref 5–15)
BUN: 25 mg/dL — ABNORMAL HIGH (ref 6–20)
CO2: 26 mmol/L (ref 22–32)
Calcium: 9.2 mg/dL (ref 8.9–10.3)
Chloride: 103 mmol/L (ref 98–111)
Creatinine, Ser: 1.19 mg/dL (ref 0.61–1.24)
GFR calc Af Amer: >60 mL/min (ref >60)
GFR calc non Af Amer: >60 mL/min (ref >60)
Glucose, Bld: 111 mg/dL — ABNORMAL HIGH (ref 70–99)
Potassium: 3.8 mmol/L (ref 3.5–5.1)
Sodium: 140 mmol/L (ref 135–145)

## 2019-12-02 LAB — CBC
HCT: 37.6 % — ABNORMAL LOW (ref 39.0–52.0)
Hemoglobin: 12.2 g/dL — ABNORMAL LOW (ref 13.0–17.0)
MCH: 30.7 pg (ref 26.0–34.0)
MCHC: 32.4 g/dL (ref 30.0–36.0)
MCV: 94.7 fL (ref 80.0–100.0)
Platelets: 200 10*3/uL (ref 150–400)
RBC: 3.97 MIL/uL — ABNORMAL LOW (ref 4.22–5.81)
RDW: 13 % (ref 11.5–15.5)
WBC: 12 10*3/uL — ABNORMAL HIGH (ref 4.0–10.5)
nRBC: 0 % (ref 0.0–0.2)

## 2019-12-02 LAB — GLUCOSE, CAPILLARY
Glucose-Capillary: 112 mg/dL — ABNORMAL HIGH (ref 70–99)
Glucose-Capillary: 114 mg/dL — ABNORMAL HIGH (ref 70–99)

## 2019-12-02 LAB — MAGNESIUM: Magnesium: 1.9 mg/dL (ref 1.7–2.4)

## 2019-12-02 MED ORDER — FUROSEMIDE 40 MG PO TABS
60.0000 mg | ORAL_TABLET | Freq: Every day | ORAL | Status: DC
  Filled 2019-12-02: qty 1

## 2019-12-02 MED ORDER — FUROSEMIDE 20 MG PO TABS: 60.0000 mg | ORAL_TABLET | Freq: Every day | ORAL | 0 refills | Status: DC

## 2019-12-02 NOTE — Progress Notes (Signed)
SATURATION QUALIFICATIONS: (This note is used to comply with regulatory documentation for home oxygen)  Patient Saturations on Room Air at Rest = 97%  Patient Saturations on Room Air while Ambulating = 91%   

## 2019-12-02 NOTE — Progress Notes (Signed)
Progress Note  Patient Name: [name] Date of Encounter: 12/02/2019  [facility] Cardiologist: Rozann Lesches, MD   Subjective   Diuresed another 2.3L overnight - now 6L negative. No further SVT overnight- seen by EP, no plans for inpatient ablation during this admission.  Inpatient Medications    Scheduled Meds: . allopurinol  300 mg Oral Daily  . aspirin EC  81 mg Oral Daily  . atorvastatin  40 mg Oral Daily  . carvedilol  37.5 mg Oral BID WC  . Chlorhexidine Gluconate Cloth  6 each Topical Daily  . cloNIDine  0.3 mg Oral BID  . furosemide  40 mg Intravenous BID  . insulin aspart  0-5 Units Subcutaneous QHS  . insulin aspart  0-6 Units Subcutaneous TID WC  . rivaroxaban  10 mg Oral Daily  . sodium chloride flush  3 mL Intravenous Q12H   Continuous Infusions: . sodium chloride     PRN Meds: sodium chloride, acetaminophen **OR** acetaminophen, albuterol, docusate sodium, guaiFENesin-dextromethorphan, HYDROcodone-acetaminophen, labetalol, ondansetron **OR** ondansetron (ZOFRAN) IV, polyethylene glycol, sodium chloride flush, traZODone   Vital Signs    Vitals:   12/02/19 0856 12/02/19 0858 12/02/19 0859 12/02/19 0946  BP:    (!) 151/90  Pulse:    88  Resp:      Temp:      TempSrc:      SpO2: 97% 91% 92% 100%  Weight:      Height:        Intake/Output Summary (Last 24 hours) at 12/02/2019 1230 Last data filed at 12/02/2019 1158 Gross per 24 hour  Intake 963 ml  Output 4450 ml  Net -3487 ml   Last 3 Weights 12/02/2019 12/02/2019 12/01/2019  Weight (lbs) 312 lb 9.8 oz 312 lb 9.8 oz 315 lb 9.6 oz  Weight (kg) 141.8 kg 141.8 kg 143.155 kg      Telemetry    Sinus rhythm, no SVT - Personally Reviewed  ECG     N/A  Physical Exam   GEN: No acute distress.   Neck: JVD not clearly seen but is elevated Cardiac: RRR, no murmurs, rubs, or gallops.  Respiratory: decreased BS bases, deep inspiration causes cough GI: firm, nontender, non-distended    MS: R>L LE edema; No deformity. Neuro:  Nonfocal  Psych: Normal affect   Labs    High Sensitivity Troponin:   Recent Labs  Lab 11/27/19 0833 11/27/19 1030  TROPONINIHS 47* 45*      Chemistry Recent Labs  Lab 11/27/19 0833 11/27/19 0833 11/28/19 0514 11/29/19 0735 11/30/19 0508 12/01/19 1144 12/02/19 0710  NA 142   < > 140   < > 138 141 140  K 3.4*   < > 4.5   < > 4.0 3.8 3.8  CL 104   < > 103   < > 101 104 103  CO2 27   < > 25   < > 26 26 26   GLUCOSE 119*   < > 126*   < > 142* 114* 111*  BUN 15   < > 23*   < > 37* 27* 25*  CREATININE 1.08   < > 1.81*   < > 1.51* 1.22 1.19  CALCIUM 8.7*   < > 8.7*   < > 8.4* 8.9 9.2  PROT 6.6  --  6.8  --   --   --   --   ALBUMIN 3.6  --  3.4*  --   --   --   --  AST 27  --  18  --   --   --   --   ALT 31  --  27  --   --   --   --   ALKPHOS 61  --  62  --   --   --   --   BILITOT 1.1  --  1.5*  --   --   --   --   GFRNONAA >60   < > 41*   < > 51* >60 >60  GFRAA >60   < > 47*   < > 59* >60 >60  ANIONGAP 11   < > 12   < > 11 11 11    < > = values in this interval not displayed.     Hematology Recent Labs  Lab 11/30/19 0508 12/01/19 0906 12/02/19 0710  WBC 11.9* 10.8* 12.0*  RBC 3.85* 4.08* 3.97*  HGB 11.9* 12.5* 12.2*  HCT 37.6* 38.3* 37.6*  MCV 97.7 93.9 94.7  MCH 30.9 30.6 30.7  MCHC 31.6 32.6 32.4  RDW 13.1 13.1 13.0  PLT 179 185 200    BNP Recent Labs  Lab 11/27/19 0833  BNP 390.0*    Lab Results  Component Value Date   TSH 2.054 11/28/2019   Lab Results  Component Value Date   HGBA1C 6.3 06/22/2008    Radiology    No results found.  Cardiac Studies   ECHO: 11/28/2019 1. Left ventricular ejection fraction, by estimation, is 40 to 45%. The  left ventricle has mildly decreased function. The left ventricle  demonstrates global hypokinesis. Left ventricular diastolic parameters are  consistent with Grade II diastolic  dysfunction (pseudonormalization).  2. Right ventricular systolic function is  normal. The right ventricular  size is normal. There is normal pulmonary artery systolic pressure.  3. The inferior vena cava is dilated in size with >50% respiratory  variability, suggesting right atrial pressure of 8 mmHg.  4. Limited study to evaluate LV and RV function.   Patient Profile     [name] 59 yo male history of remote PAF, HTN, HL, DM2, OSA, resistant HTN, chronic diastolic HF. Presents with SOB and LE edema. Reports having a cold about 1 week ago. Never fully recovered. Started having progressive SOB and LE edema, orthopnea.    Assessment & Plan    1. Acute on chronic diastolic HF - I/O net -2.4 L but intake is incomplete, wt down 5 lbs - got Lasix 40 mg IV on 07/19 and 20 mg IV on 07/20 - Lasix 40 mg po qd started 07/21 - grade 2 dd, felt 2nd HTN, OHS - REPEAT CXR PENDING - pta on losartan 100 mg and aldactone 25 mg qd>>both held due to increased BUN/Cr - good diuresis, wants to go home - will switch to oral lasix 60 mg daily.  2. 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 Severe OSA - tried on CPAP, but did not wear last pm due to coughing.   4. ARI - BUN/Cr now back to baseline at 1.19 - off ARB/Aldactone   For questions or updates, please contact [place] Please consult [email or web address] for contact info under   Pixie Casino, MD, FACC, [place] Director of the [facility] Diplomate of the American Board of Clinical Lipidology Attending Cardiologist  Direct Dial: [phone]  Fax: [phone]  Website:  [email or web address]  Pixie Casino, MD  12/02/2019, 12:30 PM

## 2019-12-02 NOTE — Discharge Summary (Signed)
Physician Discharge Summary  [name] BWL:[id number] DOB: 1960/08/13 DOA: 11/27/2019  PCP: [name], NP  Admit date: 11/27/2019 Discharge date: 12/02/2019  Admitted From: Home Disposition: Home  Recommendations for Outpatient Follow-up:  1. Follow up with PCP in 1-2 weeks 2. Please obtain BMP/CBC in one week your next doctors visit.  3. Increase Lasix to 40 mg daily 4. Follow-up patient cardiology   Discharge Condition: Stable CODE STATUS: Full code Diet recommendation: Heart healthy  Brief/Interim Summary: 59 y.o.maleremarkable for morbid obesity, OSA not compliant with CPAP therapy, prior history of paroxysmal atrial fibrillation in the distant past, HLD, HTN, DM2 and chronic diastolic CHF presented to ED with worsening shortness of breath and lower extremity edema for about a week now. Admitted 11/27/2019 with acute combined systolic and diastolic CHF exacerbation in the setting of persistent atrial tachycardia.  Patient was transferred to [facility] for EP eval. by the time patient arrived here his tachycardia resolved therefore no further ablation was planned.  His Lasix was increased to 40 mg daily.  Discharge home with outpatient cardiology follow-up.  Patient really wants to go home today.   Assessment & Plan:   Principal Problem:   Acute on chronic HFrEF (heart failure with reduced ejection fraction)-Combined Systolic and Diastolic dysfunction CHF Active Problems:   Resistant hypertension   Acute exacerbation of CHF (congestive heart failure) (HCC)   OSA (obstructive sleep apnea)   Obesities, morbid (HCC)   DM2 (diabetes mellitus, type 2) (HCC)   Atrial tachycardia (HCC)   Systolic congestive heart failure (HCC)  Acute respiratory distress Acute congestive heart failure with reduced ejection fraction, 40%; Class II -Echocardiogram 11/28/2019-EF 40-45%, grade 2 DD -Continue diuretics-increase home Lasix to 60 mg daily -Resume home losartan  and Coreg -Daily aspirin, appreciate cardiology  Persistent atrial tachycardia with narrow complex -Failed IV adenosine -Seen by EP-no further plans. -Coreg 37.5 mg twice daily  Obesity/OSA -CPAP  Acute kidney injury, improved -Baseline creatinine 1.0.  Creatinine today 1.19 -Losartan and Aldactone resumed  Diabetes mellitus type 2 -Resume Metformin  Essential hypertension -Continue Coreg and clonidine -Losartan and Aldactone    Body mass index is 48.96 kg/m.         Discharge Diagnoses:  Principal Problem:   Acute on chronic HFrEF (heart failure with reduced ejection fraction)-Combined Systolic and Diastolic dysfunction CHF Active Problems:   Resistant hypertension   Acute exacerbation of CHF (congestive heart failure) (HCC)   OSA (obstructive sleep apnea)   Obesities, morbid (HCC)   DM2 (diabetes mellitus, type 2) (HCC)   Atrial tachycardia (HCC)   Systolic congestive heart failure [facility])      Consultations:  Cardiology  Subjective: Patient really wants to go home today.  He still having 2 pillow orthopnea but this is his baseline at home.  Discharge Exam: Vitals:   12/02/19 0859 12/02/19 0946  BP:  (!) 151/90  Pulse:  88  Resp:    Temp:    SpO2: 92% 100%   Vitals:   12/02/19 0856 12/02/19 0858 12/02/19 0859 12/02/19 0946  BP:    (!) 151/90  Pulse:    88  Resp:      Temp:      TempSrc:      SpO2: 97% 91% 92% 100%  Weight:      Height:        General: Pt is alert, awake, not in acute distress Cardiovascular: RRR, S1/S2 +, no rubs, no gallops Respiratory: Some bibasilar crackles Abdominal: Soft, NT,  ND, bowel sounds + Extremities: no edema, no cyanosis  Discharge Instructions   Allergies as of 12/02/2019   No Known Allergies     Medication List    TAKE these medications   allopurinol 300 MG tablet Commonly known as: ZYLOPRIM Take 1 tablet (300 mg total) by mouth daily.   amLODipine 10 MG tablet Commonly known as:  NORVASC Take 10 mg by mouth daily.   aspirin EC 81 MG tablet Take 81 mg by mouth daily.   atorvastatin 40 MG tablet Commonly known as: LIPITOR Take 40 mg by mouth daily.   carvedilol 25 MG tablet Commonly known as: COREG Take 1.5 tablets (37.5 mg total) by mouth 2 (two) times daily with a meal.   cloNIDine 0.3 MG tablet Commonly known as: CATAPRES Take 0.3 mg by mouth 2 (two) times daily.   furosemide 20 MG tablet Commonly known as: LASIX Take 3 tablets (60 mg total) by mouth daily. Start taking on: December 03, 2019   losartan 100 MG tablet Commonly known as: COZAAR Take 100 mg by mouth daily.   metFORMIN 1000 MG tablet Commonly known as: GLUCOPHAGE Take 1,000 mg by mouth 2 (two) times daily.   spironolactone 25 MG tablet Commonly known as: ALDACTONE Take 25 mg by mouth daily.       Follow-up Information    Evans Lance, MD Follow up on 12/12/2019.   Specialty: Cardiology Why: Cardiology Follow-up for Tachycardia on 12/12/2019 at 8:45 AM.  Contact information: [place] [id number] [phone]        [name], NP. Schedule an appointment as soon as possible for a visit in 1 week(s).   Specialty: Nurse Practitioner Contact information: [place] [address] [phone]        Satira Sark, MD .   Specialty: Cardiology Contact information: [place] [address] [phone]              No Known Allergies  You were cared for by a hospitalist during your hospital stay. If you have any questions about your discharge medications or the care you received while you were in the hospital after you are discharged, you can call the unit and asked to speak with the hospitalist on call if the hospitalist that took care of you is not available. Once you are discharged, your primary care physician will handle any further medical issues. Please note that no refills for any discharge medications will be  authorized once you are discharged, as it is imperative that you return to your primary care physician (or establish a relationship with a primary care physician if you do not have one) for your aftercare needs so that they can reassess your need for medications and monitor your lab values.   Procedures/Studies: DG CHEST PORT 1 VIEW  Result Date: 11/30/2019 CLINICAL DATA:  Shortness of breath, cough, history diabetes mellitus, hypertension, cardiomyopathy, atrial fibrillation EXAM: PORTABLE CHEST 1 VIEW COMPARISON:  Portable exam 1010 hours compared to 11/27/2019 FINDINGS: Minimal enlargement of cardiac silhouette. Mediastinal contours and pulmonary vascularity normal. Chronic accentuation of interstitial markings central peribronchial thickening. No pulmonary infiltrate, pleural effusion or pneumothorax. Osseous structures unremarkable. IMPRESSION: Minimal enlargement of cardiac silhouette. Peribronchial thickening with persistent accentuation of interstitial markings question moderate bronchitis versus minimal perihilar edema. Electronically Signed   By: Lavonia Dana M.D.   On: 11/30/2019 11:28   DG Chest Port 1 View  Result Date: 11/27/2019 CLINICAL DATA:  Shortness of breath and dry cough EXAM: PORTABLE CHEST 1 VIEW COMPARISON:  09/03/2010 FINDINGS: Cardiac shadow is stable. Lungs are well aerated bilaterally. Increased central vascular congestion is noted with mild interstitial edema. No sizable effusion is noted. No bony abnormality is seen. IMPRESSION: Changes of mild CHF with edema. Electronically Signed   By: Inez Catalina M.D.   On: 11/27/2019 10:02   ECHOCARDIOGRAM LIMITED  Result Date: 11/28/2019    ECHOCARDIOGRAM LIMITED REPORT   Patient Name:   [name] Date of Exam: 11/28/2019 Medical Rec #:  [id number]            Height:       67.0 in Accession #:    [id number]           Weight:       312.8 lb Date of Birth:  Oct 08, 1960            BSA:          2.446 m Patient Age:    33 years              BP:           85/71 mmHg Patient Gender: M                    HR:           110 bpm. Exam Location:  [facility] Procedure: Limited Echo, Cardiac Doppler and Limited Color Doppler Indications:    Dyspnea 786.09 / R06.00  History:        Patient has prior history of Echocardiogram examinations, most                 recent 08/30/2019. Cardiomyopathy, Arrythmias:Atrial                 Fibrillation; Risk Factors:Hypertension, Diabetes and                 Dyslipidemia. Obesity,Sleep apnea (From Hx),GERD.  Sonographer:    Alvino Chapel RCS Referring Phys: [id number] [place]  1. Left ventricular ejection fraction, by estimation, is 40 to 45%. The left ventricle has mildly decreased function. The left ventricle demonstrates global hypokinesis. Left ventricular diastolic parameters are consistent with Grade II diastolic dysfunction (pseudonormalization).  2. Right ventricular systolic function is normal. The right ventricular size is normal. There is normal pulmonary artery systolic pressure.  3. The inferior vena cava is dilated in size with >50% respiratory variability, suggesting right atrial pressure of 8 mmHg.  4. Limited study to evaluate LV and RV function. FINDINGS  Left Ventricle: Left ventricular ejection fraction, by estimation, is 40 to 45%. The left ventricle has mildly decreased function. The left ventricle demonstrates global hypokinesis. Right Ventricle: The right ventricular size is normal. No increase in right ventricular wall thickness. Right ventricular systolic function is normal. There is normal pulmonary artery systolic pressure. The tricuspid regurgitant velocity is 2.41 m/s, and  with an assumed right atrial pressure of 8 mmHg, the estimated right ventricular systolic pressure is 67.8 mmHg. Pericardium: There is no evidence of pericardial effusion. Venous: The inferior vena cava is dilated in size with greater than 50% respiratory variability, suggesting right atrial  pressure of 8 mmHg. LEFT VENTRICLE PLAX 2D LVIDd:         5.98 cm LVIDs:         4.81 cm LVOT diam:     2.00 cm LVOT Area:     3.14 cm  LV  Volumes (MOD) LV vol d, MOD A2C: 137.0 ml LV vol d, MOD A4C: 148.0 ml LV vol s, MOD A2C: 77.9 ml LV vol s, MOD A4C: 85.1 ml LV SV MOD A2C:     59.1 ml LV SV MOD A4C:     148.0 ml LV SV MOD BP:      64.4 ml RIGHT VENTRICLE RV S prime:     8.70 cm/s TAPSE (M-mode): 1.8 cm MITRAL VALVE                TRICUSPID VALVE MV Area (PHT): 6.71 cm     TR Peak grad:   23.2 mmHg MV Decel Time: 113 msec     TR Vmax:        241.00 cm/s MV E velocity: 123.00 cm/s MV A velocity: 31.00 cm/s   SHUNTS MV E/A ratio:  3.97         Systemic Diam: 2.00 cm Carlyle Dolly MD Electronically signed by Carlyle Dolly MD Signature Date/Time: 11/28/2019/11:28:39 AM    Final       The results of significant diagnostics from this hospitalization (including imaging, microbiology, ancillary and laboratory) are listed below for reference.     Microbiology: Recent Results (from the past 240 hour(s))  SARS Coronavirus 2 by RT PCR (hospital order, performed in [facility] hospital lab) Nasopharyngeal Nasopharyngeal Swab     Status: None   Collection Time: 11/27/19  8:38 AM   Specimen: Nasopharyngeal Swab  Result Value Ref Range Status   SARS Coronavirus 2 NEGATIVE NEGATIVE Final    Comment: (NOTE) SARS-CoV-2 target nucleic acids are NOT DETECTED.  The SARS-CoV-2 RNA is generally detectable in upper and lower respiratory specimens during the acute phase of infection. The lowest concentration of SARS-CoV-2 viral copies this assay can detect is 250 copies / mL. A negative result does not preclude SARS-CoV-2 infection and should not be used as the sole basis for treatment or other patient management decisions.  A negative result may occur with improper specimen collection / handling, submission of specimen other than nasopharyngeal swab, presence of viral mutation(s) within the areas targeted  by this assay, and inadequate number of viral copies (<250 copies / mL). A negative result must be combined with clinical observations, patient history, and epidemiological information.  Fact Sheet for Patients:   StrictlyIdeas.no  Fact Sheet for Healthcare Providers: BankingDealers.co.za  This test is not yet approved or  cleared by the [place] FDA and has been authorized for detection and/or diagnosis of SARS-CoV-2 by FDA under an Emergency Use Authorization (EUA).  This EUA will remain in effect (meaning this test can be used) for the duration of the COVID-19 declaration under Section 564(b)(1) of the Act, 21 U.S.C. section 360bbb-3(b)(1), unless the authorization is terminated or revoked sooner.  Performed at [facility], [address], [place], [place] [address]   MRSA PCR Screening     Status: None   Collection Time: 11/27/19  8:49 PM   Specimen: Nasal Mucosa; Nasopharyngeal  Result Value Ref Range Status   MRSA by PCR NEGATIVE NEGATIVE Final    Comment:        The GeneXpert MRSA Assay (FDA approved for NASAL specimens only), is one component of a comprehensive MRSA colonization surveillance program. It is not intended to diagnose MRSA infection nor to guide or monitor treatment for MRSA infections. Performed at [facility], [address] [place], [place] [address]      Labs: BNP (last 3 results) Recent Labs  11/27/19 0833  BNP 326.7*   Basic Metabolic Panel: Recent Labs  Lab 11/28/19 0514 11/29/19 0735 11/30/19 0508 12/01/19 1144 12/02/19 0710  NA 140 139 138 141 140  K 4.5 4.5 4.0 3.8 3.8  CL 103 102 101 104 103  CO2 25 25 26 26 26   GLUCOSE 126* 117* 142* 114* 111*  BUN 23* 33* 37* 27* 25*  CREATININE 1.81* 1.63* 1.51* 1.22 1.19  CALCIUM 8.7* 8.4* 8.4* 8.9 9.2  MG 2.3 2.0 2.0 1.7 1.9   Liver Function Tests: Recent Labs  Lab 11/27/19 0833 11/28/19 0514  AST 27 18  ALT 31 27  ALKPHOS  61 62  BILITOT 1.1 1.5*  PROT 6.6 6.8  ALBUMIN 3.6 3.4*   No results for input(s): LIPASE, AMYLASE in the last 168 hours. No results for input(s): AMMONIA in the last 168 hours. CBC: Recent Labs  Lab 11/27/19 0833 11/27/19 0833 11/28/19 0514 11/29/19 0735 11/30/19 0508 12/01/19 0906 12/02/19 0710  WBC 13.3*   < > 11.9* 12.8* 11.9* 10.8* 12.0*  NEUTROABS 10.5*  --   --   --   --   --   --   HGB 12.3*   < > 12.7* 12.4* 11.9* 12.5* 12.2*  HCT 37.3*   < > 38.1* 37.9* 37.6* 38.3* 37.6*  MCV 94.4   < > 95.5 96.7 97.7 93.9 94.7  PLT 158   < > 177 175 179 185 200   < > = values in this interval not displayed.   Cardiac Enzymes: No results for input(s): CKTOTAL, CKMB, CKMBINDEX, TROPONINI in the last 168 hours. BNP: Invalid input(s): POCBNP CBG: Recent Labs  Lab 12/01/19 1109 12/01/19 1549 12/01/19 2058 12/02/19 0609 12/02/19 1112  GLUCAP 121* 121* 121* 112* 114*   D-Dimer No results for input(s): DDIMER in the last 72 hours. Hgb A1c No results for input(s): HGBA1C in the last 72 hours. Lipid Profile No results for input(s): CHOL, HDL, LDLCALC, TRIG, CHOLHDL, LDLDIRECT in the last 72 hours. Thyroid function studies No results for input(s): TSH, T4TOTAL, T3FREE, THYROIDAB in the last 72 hours.  Invalid input(s): FREET3 Anemia work up No results for input(s): VITAMINB12, FOLATE, FERRITIN, TIBC, IRON, RETICCTPCT in the last 72 hours. Urinalysis    Component Value Date/Time   COLORURINE AMBER BIOCHEMICALS MAY BE AFFECTED BY COLOR (A) 09/03/2010 2030   APPEARANCEUR CLEAR 09/03/2010 2030   LABSPEC >1.030 (H) 09/03/2010 2030   PHURINE 5.5 09/03/2010 2030   GLUCOSEU NEGATIVE 09/03/2010 2030   [place] NEGATIVE 09/03/2010 2030   HGBUR negative 07/20/2008 1256   BILIRUBINUR SMALL (A) 09/03/2010 2030   KETONESUR NEGATIVE 09/03/2010 2030   PROTEINUR >300 (A) 09/03/2010 2030   UROBILINOGEN 1.0 09/03/2010 2030   NITRITE NEGATIVE 09/03/2010 2030   LEUKOCYTESUR NEGATIVE  09/03/2010 2030   Sepsis Labs Invalid input(s): PROCALCITONIN,  WBC,  LACTICIDVEN Microbiology Recent Results (from the past 240 hour(s))  SARS Coronavirus 2 by RT PCR (hospital order, performed in [place] hospital lab) Nasopharyngeal Nasopharyngeal Swab     Status: None   Collection Time: 11/27/19  8:38 AM   Specimen: Nasopharyngeal Swab  Result Value Ref Range Status   SARS Coronavirus 2 NEGATIVE NEGATIVE Final    Comment: (NOTE) SARS-CoV-2 target nucleic acids are NOT DETECTED.  The SARS-CoV-2 RNA is generally detectable in upper and lower respiratory specimens during the acute phase of infection. The lowest concentration of SARS-CoV-2 viral copies this assay can detect is 250 copies / mL. A negative result does not  preclude SARS-CoV-2 infection and should not be used as the sole basis for treatment or other patient management decisions.  A negative result may occur with improper specimen collection / handling, submission of specimen other than nasopharyngeal swab, presence of viral mutation(s) within the areas targeted by this assay, and inadequate number of viral copies (<250 copies / mL). A negative result must be combined with clinical observations, patient history, and epidemiological information.  Fact Sheet for Patients:   StrictlyIdeas.no  Fact Sheet for Healthcare Providers: BankingDealers.co.za  This test is not yet approved or  cleared by the [place] FDA and has been authorized for detection and/or diagnosis of SARS-CoV-2 by FDA under an Emergency Use Authorization (EUA).  This EUA will remain in effect (meaning this test can be used) for the duration of the COVID-19 declaration under Section 564(b)(1) of the Act, 21 U.S.C. section 360bbb-3(b)(1), unless the authorization is terminated or revoked sooner.  Performed at [facility], [address] [place], [place] [address]   MRSA PCR Screening     Status:  None   Collection Time: 11/27/19  8:49 PM   Specimen: Nasal Mucosa; Nasopharyngeal  Result Value Ref Range Status   MRSA by PCR NEGATIVE NEGATIVE Final    Comment:        The GeneXpert MRSA Assay (FDA approved for NASAL specimens only), is one component of a comprehensive MRSA colonization surveillance program. It is not intended to diagnose MRSA infection nor to guide or monitor treatment for MRSA infections. Performed at [facility], [address] [place], [place] [address]      Time coordinating discharge:  I have spent 35 minutes face to face with the patient and on the ward discussing the patients care, assessment, plan and disposition with other care givers. >50% of the time was devoted counseling the patient about the risks and benefits of treatment/Discharge disposition and coordinating care.   SIGNED:   Damita Lack, MD  [facility] 12/02/2019, 1:03 PM   If 7PM-7AM, please contact night-coverage

## 2019-12-12 ENCOUNTER — Other Ambulatory Visit: Payer: Self-pay

## 2019-12-12 ENCOUNTER — Encounter: Payer: Self-pay | Admitting: Internal Medicine

## 2019-12-12 ENCOUNTER — Ambulatory Visit: Payer: Medicare HMO | Admitting: Internal Medicine

## 2019-12-12 VITALS — BP 112/70 | HR 85 | Ht 66.0 in | Wt 310.0 lb

## 2019-12-12 DIAGNOSIS — I471 Supraventricular tachycardia: Secondary | ICD-10-CM | POA: Diagnosis not present

## 2019-12-12 DIAGNOSIS — I10 Essential (primary) hypertension: Secondary | ICD-10-CM

## 2019-12-12 NOTE — Patient Instructions (Signed)
Medication Instructions:  Your physician recommends that you continue on your current medications as directed. Please refer to the Current Medication list given to you today.  *If you need a refill on your cardiac medications before your next appointment, please call your pharmacy*   Lab Work: NONE   If you have labs (blood work) drawn today and your tests are completely normal, you will receive your results only by: MyChart Message (if you have MyChart) OR A paper copy in the mail If you have any lab test that is abnormal or we need to change your treatment, we will call you to review the results.   Testing/Procedures: NONE    Follow-Up: At [facility], you and your health needs are our priority.  As part of our continuing mission to provide you with exceptional heart care, we have created designated Provider Care Teams.  These Care Teams include your primary Cardiologist (physician) and Advanced Practice Providers (APPs -  Physician Assistants and Nurse Practitioners) who all work together to provide you with the care you need, when you need it.  We recommend signing up for the patient portal called "MyChart".  Sign up information is provided on this After Visit Summary.  MyChart is used to connect with patients for Virtual Visits (Telemedicine).  Patients are able to view lab/test results, encounter notes, upcoming appointments, etc.  Non-urgent messages can be sent to your provider as well.   To learn more about what you can do with MyChart, go to [email or web address].    Your next appointment:   6 month(s)  The format for your next appointment:   In Person  Provider:   Gregg Taylor, MD   Other Instructions Thank you for choosing [facility]!    

## 2019-12-12 NOTE — Progress Notes (Signed)
HPI [name] is referred today by Dr. Johnsie Cancel for evaluation of SVT. He is a pleasant 59 yo man with a h/o HTN and DM. He developed palpitations 2 weeks ago and was seen in the ED where he was found to be in SVT. He was treated with IV adenosine and then beta blockers and calcium channel blockers and was transferred to [facility] where his heart racing stopped. He has not had any since. He notes a single episode last year which stopped on its own. He remains active. He has some peripheral edema which has improved since his dose of lasix was increased. In SVT he gets sob.  No Known Allergies   Current Outpatient Medications  Medication Sig Dispense Refill  . allopurinol (ZYLOPRIM) 300 MG tablet Take 1 tablet (300 mg total) by mouth daily. 30 tablet 5  . amLODipine (NORVASC) 10 MG tablet Take 10 mg by mouth daily.    Marland Kitchen aspirin EC 81 MG tablet Take 81 mg by mouth daily.    Marland Kitchen atorvastatin (LIPITOR) 40 MG tablet Take 40 mg by mouth daily.    . carvedilol (COREG) 25 MG tablet Take 1.5 tablets (37.5 mg total) by mouth 2 (two) times daily with a meal. 270 tablet 3  . cloNIDine (CATAPRES) 0.3 MG tablet Take 0.3 mg by mouth 2 (two) times daily.     . furosemide (LASIX) 20 MG tablet Take 3 tablets (60 mg total) by mouth daily. 30 tablet 0  . losartan (COZAAR) 100 MG tablet Take 100 mg by mouth daily.    . metFORMIN (GLUCOPHAGE) 1000 MG tablet Take 1,000 mg by mouth 2 (two) times daily.    Marland Kitchen spironolactone (ALDACTONE) 25 MG tablet Take 25 mg by mouth daily.     No current facility-administered medications for this visit.     Past Medical History:  Diagnosis Date  . Cardiomyopathy ([place])   . Essential hypertension   . GERD (gastroesophageal reflux disease)   . Gout   . Mixed hyperlipidemia   . Paroxysmal atrial fibrillation (HCC)   . Sleep apnea   . Type 2 diabetes mellitus (HCC)     ROS:   All systems reviewed and negative except as noted in the HPI.   Past Surgical History:    Procedure Laterality Date  . BUNIONECTOMY    . COLONOSCOPY WITH PROPOFOL N/A 12/06/2017   Procedure: COLONOSCOPY WITH PROPOFOL;  Surgeon: Jonathon Bellows, MD;  Location: [facility] ENDOSCOPY;  Service: Gastroenterology;  Laterality: N/A;     Family History  Problem Relation Age of Onset  . Hypertension Mother   . Diabetes Mother   . Heart disease Father   . Hypertension Father   . Hypertension Sister   . Hypertension Brother   . Diabetes Brother      Social History   Socioeconomic History  . Marital status: Married    Spouse name: Not on file  . Number of children: Not on file  . Years of education: Not on file  . Highest education level: Not on file  Occupational History  . Not on file  Tobacco Use  . Smoking status: Never Smoker  . Smokeless tobacco: Current User    Types: Chew  Vaping Use  . Vaping Use: Never used  Substance and Sexual Activity  . Alcohol use: Yes    Alcohol/week: 21.0 standard drinks    Types: 21 Cans of beer per week    Comment: 2-3 beer dailys  . Drug use:  Never  . Sexual activity: Not on file  Other Topics Concern  . Not on file  Social History Narrative  . Not on file   Social Determinants of Health   Financial Resource Strain:   . Difficulty of Paying Living Expenses:   Food Insecurity:   . Worried About Charity fundraiser in the Last Year:   . Arboriculturist in the Last Year:   Transportation Needs:   . Film/video editor (Medical):   Marland Kitchen Lack of Transportation (Non-Medical):   Physical Activity:   . Days of Exercise per Week:   . Minutes of Exercise per Session:   Stress:   . Feeling of Stress :   Social Connections:   . Frequency of Communication with Friends and Family:   . Frequency of Social Gatherings with Friends and Family:   . Attends Religious Services:   . Active Member of Clubs or Organizations:   . Attends Archivist Meetings:   Marland Kitchen Marital Status:   Intimate Partner Violence:   . Fear of Current or  Ex-Partner:   . Emotionally Abused:   Marland Kitchen Physically Abused:   . Sexually Abused:      BP 112/70 (BP Location: Right Arm, Patient Position: Sitting, Cuff Size: Large)   Pulse 85   Ht 5\' 6"  (1.676 m)   Wt (!) 310 lb (140.6 kg)   SpO2 96% Comment: at rest  BMI 50.04 kg/m   Physical Exam:  Well appearing NAD HEENT: Unremarkable Neck:  No JVD, no thyromegally Lymphatics:  No adenopathy Back:  No CVA tenderness Lungs:  Clear with no wheezes HEART:  Regular rate rhythm, no murmurs, no rubs, no clicks Abd:  soft, positive bowel sounds, no organomegally, no rebound, no guarding Ext:  2 plus pulses, no edema, no cyanosis, no clubbing Skin:  No rashes no nodules Neuro:  CN II through XII intact, motor grossly intact  EKG - NSR with no pre-excitation.    Assess/Plan: 1. SVT - I have reviewed the ECG strips. He has atrial tachycardiia with the P wave in the T wave. I have reviewed the treatment options. He has done well since increasing the dose of coreg. If his SVT remains quiet he will continue medical therapy. If he has more SVT, then EP study and ablation would be recommended.  2. HTN - his bp is well controlled today. He is encouraged to avoid salty foods. 3. Mixed systolic/diastolic CHF - he will contiue his beta blocker and losartan and lasix and aldactone. I discussed water intake and told him to drink when he is thirsty but not more.  Mikle [email or web address].D.

## 2019-12-13 ENCOUNTER — Other Ambulatory Visit: Payer: Self-pay

## 2019-12-13 ENCOUNTER — Encounter: Payer: Self-pay | Admitting: Pulmonary Disease

## 2019-12-13 ENCOUNTER — Ambulatory Visit: Payer: Medicare HMO | Admitting: Pulmonary Disease

## 2019-12-13 DIAGNOSIS — I5022 Chronic systolic (congestive) heart failure: Secondary | ICD-10-CM | POA: Diagnosis not present

## 2019-12-13 DIAGNOSIS — G4733 Obstructive sleep apnea (adult) (pediatric): Secondary | ICD-10-CM | POA: Diagnosis not present

## 2019-12-13 NOTE — Progress Notes (Signed)
Subjective:    Patient ID: [name], male    DOB: [birth year], 59 y.o.   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 He has been evaluated by cardiology for refractory hypertension, requiring 4 medications and more recently he had an SVT 7/19 which broke with adenosine, beta-blocker and calcium channel blocker.  EP consultation revealed this to be an atrial tachycardia and medical management was advised Echo shows that his EF is dropped to 45% suggesting hypertensive heart disease  He was diagnosed with severe OSA in 2010 and required up to 21 cm with inadequate titration.  He did not tolerate the high pressures then but is willing to try again.  In fact he had a very good result when he was placed on PAP during his hospital stay  Epworth sleepiness score is 16 Bedtime is around 10 PM, sleep latency is minimal, he sleeps on his right side with 2 pillows, reports 2-3 nocturnal awakenings including nocturia and is up around 5 AM feeling rested with dryness of mouth but denies headaches.  His weight is mostly unchanged over the past 10 years  There is no history suggestive of cataplexy, sleep paralysis or parasomnias  Significant tests/ events reviewed Echo 11/2019 -EF 45%, global hypokinesis, grade 2 diastolic dysfunction  11/6281 split study -309 lbs -AHI 109/hour, lowest desaturation 74%, CPAP titrated to 21 cm, inadequate with residual AHI 14/hour  Past Medical History:  Diagnosis Date  . Cardiomyopathy ([place])   . Essential hypertension   . GERD (gastroesophageal reflux disease)   . Gout   . Mixed hyperlipidemia   . Paroxysmal atrial fibrillation (HCC)   . Sleep apnea   . Type 2 diabetes mellitus ([place])    Past Surgical History:  Procedure Laterality Date  . BUNIONECTOMY    . COLONOSCOPY WITH PROPOFOL N/A 12/06/2017   Procedure: COLONOSCOPY WITH PROPOFOL;  Surgeon: Jonathon Bellows, MD;  Location: [facility]  ENDOSCOPY;  Service: Gastroenterology;  Laterality: N/A;    No Known Allergies  Social History   Socioeconomic History  . Marital status: Married    Spouse name: Not on file  . Number of children: Not on file  . Years of education: Not on file  . Highest education level: Not on file  Occupational History  . Not on file  Tobacco Use  . Smoking status: Never Smoker  . Smokeless tobacco: Current User    Types: Chew  Vaping Use  . Vaping Use: Never used  Substance and Sexual Activity  . Alcohol use: Yes    Alcohol/week: 21.0 standard drinks    Types: 21 Cans of beer per week    Comment: 2-3 beer dailys  . Drug use: Never  . Sexual activity: Not on file  Other Topics Concern  . Not on file  Social History Narrative  . Not on file   Social Determinants of Health   Financial Resource Strain:   . Difficulty of Paying Living Expenses:   Food Insecurity:   . Worried About Charity fundraiser in the Last Year:   . Arboriculturist in the Last Year:   Transportation Needs:   . Film/video editor (Medical):   Marland Kitchen Lack of Transportation (Non-Medical):   Physical Activity:   . Days of Exercise per Week:   . Minutes of Exercise per Session:   Stress:   . Feeling of Stress :   Social Connections:   . Frequency of  Communication with Friends and Family:   . Frequency of Social Gatherings with Friends and Family:   . Attends Religious Services:   . Active Member of Clubs or Organizations:   . Attends Archivist Meetings:   Marland Kitchen Marital Status:   Intimate Partner Violence:   . Fear of Current or Ex-Partner:   . Emotionally Abused:   Marland Kitchen Physically Abused:   . Sexually Abused:     Family History  Problem Relation Age of Onset  . Hypertension Mother   . Diabetes Mother   . Heart disease Father   . Hypertension Father   . Hypertension Sister   . Hypertension Brother   . Diabetes Brother       Review of Systems Constitutional: negative for anorexia, fevers and  sweats  Eyes: negative for irritation, redness and visual disturbance  Ears, nose, mouth, throat, and face: negative for earaches, epistaxis, nasal congestion and sore throat  Respiratory: negative for cough,  sputum and wheezing  Cardiovascular: negative for chest pain, lower extremity edema, orthopnea, palpitations and syncope  Gastrointestinal: negative for abdominal pain, constipation, diarrhea, melena, nausea and vomiting  Genitourinary:negative for dysuria, frequency and hematuria  Hematologic/lymphatic: negative for bleeding, easy bruising and lymphadenopathy  Musculoskeletal:negative for arthralgias, muscle weakness and stiff joints  Neurological: negative for coordination problems, gait problems, headaches and weakness  Endocrine: negative for diabetic symptoms including polydipsia, polyuria and weight loss     Objective:   Physical Exam  Gen. Pleasant, obese, in no distress, normal affect ENT - no pallor,icterus, no post nasal drip, class 2-3 airway Neck: No JVD, no thyromegaly, no carotid bruits Lungs: no use of accessory muscles, no dullness to percussion, decreased without rales or rhonchi  Cardiovascular: Rhythm regular, heart sounds  normal, no murmurs or gallops, no peripheral edema Abdomen: soft and non-tender, no hepatosplenomegaly, BS normal. Musculoskeletal: No deformities, no cyanosis or clubbing Neuro:  alert, non focal, no tremors       Assessment & Plan:

## 2019-12-13 NOTE — Patient Instructions (Signed)
You have severe OSA CPAP titration study will be scheduled Based on this, we will get you started on a CPAP machine  Cancel appt with dr Claiborne Billings in [facility] on 8/9

## 2019-12-13 NOTE — Assessment & Plan Note (Signed)
Seems to be related to hypertensive heart disease. Relation of OSA to atrial arrhythmias, cardiomyopathy and refractory hypertension were discussed

## 2019-12-13 NOTE — Assessment & Plan Note (Signed)
He has severe OSA which is impacting cardiovascular morbidity with refractory hypertension and SVTs.  In the past he did not tolerate the high pressure required, up to 21 cm even though this was done  with using an auto CPAP. He may even need BiPAP.  We will set him up for another titration study, he  liked the machine that he used in the hospital .  Hopefully we can find him a good fitting full facemask, and get him better control on auto settings  The pathophysiology of obstructive sleep apnea , it's cardiovascular consequences & modes of treatment including CPAP were discused with the patient in detail & they evidenced understanding. Weight loss encouraged, compliance with goal of at least 4-6 hrs every night is the expectation. Advised against medications with sedative side effects Cautioned against driving when sleepy - understanding that sleepiness will vary on a day to day basis

## 2019-12-18 ENCOUNTER — Ambulatory Visit: Payer: Medicare HMO | Admitting: Cardiovascular Disease

## 2019-12-28 ENCOUNTER — Ambulatory Visit: Payer: Medicare HMO | Attending: Pulmonary Disease | Admitting: Pulmonary Disease

## 2019-12-28 ENCOUNTER — Other Ambulatory Visit: Payer: Self-pay

## 2019-12-28 DIAGNOSIS — G4733 Obstructive sleep apnea (adult) (pediatric): Secondary | ICD-10-CM | POA: Insufficient documentation

## 2020-01-02 ENCOUNTER — Telehealth: Payer: Self-pay | Admitting: Pulmonary Disease

## 2020-01-02 NOTE — Telephone Encounter (Signed)
Pl send Rx to DME for  BiPAP 16/12 cm H2O with a Large size Fisher[employer]Paykel Full Face Mask Simplus mask and heated humidification.  OV with me in 6-8 weeks

## 2020-01-02 NOTE — Procedures (Signed)
Patient Name: [name], [name] Date: 12/28/2019 Gender: Male D.O.B: 10/25/1960 Age (years): 16 Referring Provider: Kara Mead MD, ABSM Height (inches): 66 Interpreting Physician: Kara Mead MD, ABSM Weight (lbs): 310 RPSGT: Peak, Robert BMI: 50 MRN: [id number] Neck Size: 19.50 <br> <br> CLINICAL INFORMATION The patient is referred for a BiPAP titration to treat sleep apnea.    Date of NPSG: 08/2008 split study -309 lbs -AHI 109/hour, lowest desaturation 74%, CPAP titrated to 21 cm, inadequate with residual AHI 14/hour  SLEEP STUDY TECHNIQUE As per the AASM Manual for the Scoring of Sleep and Associated Events v2.3 (April 2016) with a hypopnea requiring 4% desaturations.  The channels recorded and monitored were frontal, central and occipital EEG, electrooculogram (EOG), submentalis EMG (chin), nasal and oral airflow, thoracic and abdominal wall motion, anterior tibialis EMG, snore microphone, electrocardiogram, and pulse oximetry. Bilevel positive airway pressure (BPAP) was initiated at the beginning of the study and titrated to treat sleep-disordered breathing.  MEDICATIONS Medications self-administered by patient taken the night of the study : N/A  RESPIRATORY PARAMETERS Optimal IPAP Pressure (cm): 16 AHI at Optimal Pressure (/hr) 2.9 Optimal EPAP Pressure (cm): 12   Overall Minimal O2 (%): 64.0 Minimal O2 at Optimal Pressure (%): 81.0 SLEEP ARCHITECTURE Start Time: 9:40:43 PM Stop Time: 4:55:32 AM Total Time (min): 434.8 Total Sleep Time (min): 298.9 Sleep Latency (min): 22.9 Sleep Efficiency (%): 68.7% REM Latency (min): 79.5 WASO (min): 113.0 Stage N1 (%): 9.7% Stage N2 (%): 73.2% Stage N3 (%): 0.0% Stage R (%): 17.1 Supine (%): 0.33 Arousal Index (/hr): 18.3     CARDIAC DATA The 2 lead EKG demonstrated sinus rhythm. The mean heart rate was 75.7 beats per minute. Other EKG findings include: None. LEG MOVEMENT DATA The total Periodic Limb Movements of Sleep  (PLMS) were 0. The PLMS index was 0.0. A PLMS index of <15 is considered normal in adults.  IMPRESSIONS - An optimal PAP pressure was selected for this patient ( 16 /12 cm of water). Did not tolerate CPAP due to high pressure - Mild Central Sleep Apnea was noted during this titration (CAI = 11.6/h). - Severe oxygen desaturations were observed during this titration (min O2 = 64.0%). - The patient snored with loud snoring volume. - No cardiac abnormalities were observed during this study. - Clinically significant periodic limb movements were not noted during this study. Arousals associated with PLMs were rare.   DIAGNOSIS - Obstructive Sleep Apnea (G47.33)   RECOMMENDATIONS - Trial of BiPAP therapy on 16/12 cm H2O with a Large size Fisher&Paykel Full Face Mask Simplus mask and heated humidification. - Avoid alcohol, sedatives and other CNS depressants that may worsen sleep apnea and disrupt normal sleep architecture. - Sleep hygiene should be reviewed to assess factors that may improve sleep quality. - Weight management and regular exercise should be initiated or continued. - Return to Sleep Center for re-evaluation after 4 weeks of therapy   Kara Mead MD Board Certified in [place]

## 2020-01-03 ENCOUNTER — Other Ambulatory Visit: Payer: Self-pay | Admitting: Pulmonary Disease

## 2020-01-03 DIAGNOSIS — G4733 Obstructive sleep apnea (adult) (pediatric): Secondary | ICD-10-CM

## 2020-01-03 NOTE — Telephone Encounter (Signed)
What setting do you want for pt bipap Max pap:                                                                Min: Epap:                                                        Pressure support:

## 2020-01-03 NOTE — Telephone Encounter (Signed)
16/12/4 

## 2020-01-04 ENCOUNTER — Encounter: Payer: Self-pay | Admitting: *Deleted

## 2020-01-04 ENCOUNTER — Encounter: Payer: Self-pay | Admitting: Student

## 2020-01-04 ENCOUNTER — Other Ambulatory Visit: Payer: Self-pay

## 2020-01-04 ENCOUNTER — Ambulatory Visit: Payer: Medicare HMO | Admitting: Student

## 2020-01-04 VITALS — BP 118/82 | HR 80 | Ht 66.0 in | Wt 310.4 lb

## 2020-01-04 DIAGNOSIS — I5042 Chronic combined systolic (congestive) and diastolic (congestive) heart failure: Secondary | ICD-10-CM | POA: Diagnosis not present

## 2020-01-04 DIAGNOSIS — I429 Cardiomyopathy, unspecified: Secondary | ICD-10-CM | POA: Diagnosis not present

## 2020-01-04 DIAGNOSIS — G4733 Obstructive sleep apnea (adult) (pediatric): Secondary | ICD-10-CM

## 2020-01-04 DIAGNOSIS — E785 Hyperlipidemia, unspecified: Secondary | ICD-10-CM

## 2020-01-04 DIAGNOSIS — I10 Essential (primary) hypertension: Secondary | ICD-10-CM

## 2020-01-04 DIAGNOSIS — I471 Supraventricular tachycardia: Secondary | ICD-10-CM | POA: Diagnosis not present

## 2020-01-04 MED ORDER — FUROSEMIDE 20 MG PO TABS: 60.0000 mg | ORAL_TABLET | Freq: Every day | ORAL | 3 refills | Status: DC

## 2020-01-04 NOTE — Progress Notes (Signed)
Cardiology Office Note    Date:  01/04/2020   ID:  Andrew [name] [name], DOB 1960/10/05, MRN [id number]  PCP:  Andrew [name] Cara, NP  Cardiologist: Andrew [name] Lesches, MD    Chief Complaint  Patient presents with  . Follow-up    3 month visit    History of Present Illness:    Andrew [name] [name] is a 59 y.o. [name] with past medical history of remote paroxysmal atrial [name], Andrew [name] [name], Andrew [name] [name], Andrew [name] [name], Andrew [name] [name] who presents to the office today for 46-month follow-up.  He was last examined by myself in 09/2019 and denied any recent chest pain or dyspnea on exertion. BP was elevated to 168/102 during his visit and Coreg was titrated to 37.5 mg twice daily with him being continued on Amlodipine 10 mg daily, Clonidine 0.3 mg BID, Hydralazine 100 mg 3 times daily, Losartan 100 mg daily and Spironolactone 25 mg daily. He was referred to Pulmonology for a repeat sleep study.  In the interim, he presented to [facility] ED on 11/27/2019 for evaluation of worsening dyspnea and lower extremity edema. He was found to be in Andrew [name] [name] upon arrival and was given Adenosine along with IV Lopressor and Cardizem. He was treated with IV Lasix during his admission for an acute CHF exacerbation. Repeat echocardiogram did show his EF was reduced at 40 to 45% with global hypokinesis and grade 2 diastolic dysfunction. 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 Given recurrent tachycardia during admission, he was transferred to [facility] for EP evaluation but did not have any recurrences after transfer.  He was continued on Coreg 37.5 mg twice daily and outpatient follow-up with Andrew [name] [name] was recommended.  He was discharged on Lasix 60 mg twice daily.  He did follow-up with Andrew [name] [name] on 12/12/2019 and it was felt that he had atrial tachycardia and options were reviewed in regards to medical therapy versus ablation. Given no recurrent symptoms, he wished to continue with  medical therapy at that time.  In talking with the patient today, he reports overall doing well since his last visit. His breathing has been at baseline and he denies any recurrent palpitations. No recent chest pain, orthopnea, PND or lower extremity edema. He reports good compliance with his current medication regimen.   Past Medical History:  Diagnosis Date  . Cardiomyopathy ([place])   . Essential hypertension   . GERD (gastroesophageal reflux disease)   . Gout   . Mixed hyperlipidemia   . Paroxysmal atrial [name] (HCC)   . Sleep apnea   . Andrew [name] 2 diabetes mellitus ([place])     Past Surgical History:  Procedure Laterality Date  . BUNIONECTOMY    . COLONOSCOPY WITH PROPOFOL N/A 12/06/2017   Procedure: COLONOSCOPY WITH PROPOFOL;  Surgeon: Andrew [name] Bellows, MD;  Location: [facility] ENDOSCOPY;  Service: Gastroenterology;  Laterality: N/A;    Current Medications: Outpatient Medications Prior to Visit  Medication Sig Dispense Refill  . allopurinol (ZYLOPRIM) 300 MG tablet Take 1 tablet (300 mg total) by mouth daily. 30 tablet 5  . amLODipine (NORVASC) 10 MG tablet Take 10 mg by mouth daily.    Marland Kitchen aspirin EC 81 MG tablet Take 81 mg by mouth daily.    Marland Kitchen atorvastatin (LIPITOR) 40 MG tablet Take 40 mg by mouth daily.    . carvedilol (COREG) 25 MG tablet Take 1.5 tablets (37.5 mg total) by mouth 2 (two) times daily with a meal.  270 tablet 3  . cloNIDine (CATAPRES) 0.3 MG tablet Take 0.3 mg by mouth 2 (two) times daily.     Marland Kitchen losartan (COZAAR) 100 MG tablet Take 100 mg by mouth daily.    . metFORMIN (GLUCOPHAGE) 1000 MG tablet Take 1,000 mg by mouth 2 (two) times daily.    Marland Kitchen spironolactone (ALDACTONE) 25 MG tablet Take 25 mg by mouth daily.    . furosemide (LASIX) 20 MG tablet Take 3 tablets (60 mg total) by mouth daily. 30 tablet 0   No facility-administered medications prior to visit.     Allergies:   Patient has no known allergies.   Social History   Socioeconomic History  . Marital status:  Married    Spouse name: Not on file  . Number of children: Not on file  . Years of education: Not on file  . Highest education level: Not on file  Occupational History  . Not on file  Tobacco Use  . Smoking status: Never Smoker  . Smokeless tobacco: Current User    Types: Chew  Vaping Use  . Vaping Use: Never used  Substance and Sexual Activity  . Alcohol use: Yes    Alcohol/week: 21.0 standard drinks    Types: 21 Cans of beer per week    Comment: 2-3 beer dailys  . Drug use: Never  . Sexual activity: Not on file  Other Topics Concern  . Not on file  Social History Narrative  . Not on file   Social Determinants of Health   Financial Resource Strain:   . Difficulty of Paying Living Expenses: Not on file  Food Insecurity:   . Worried About Charity fundraiser in the Last Year: Not on file  . Ran Out of Food in the Last Year: Not on file  Transportation Needs:   . Lack of Transportation (Medical): Not on file  . Lack of Transportation (Non-Medical): Not on file  Physical Activity:   . Days of Exercise per Week: Not on file  . Minutes of Exercise per Session: Not on file  Stress:   . Feeling of Stress : Not on file  Social Connections:   . Frequency of Communication with Friends and Family: Not on file  . Frequency of Social Gatherings with Friends and Family: Not on file  . Attends Religious Services: Not on file  . Active Member of Clubs or Organizations: Not on file  . Attends Archivist Meetings: Not on file  . Marital Status: Not on file     Family History:  The patient's family history includes Diabetes in his brother and mother; Heart disease in his father; Hypertension in his brother, father, mother, and sister.   Review of Systems:   Please see the history of present illness.     General:  No chills, fever, night sweats or weight changes. Positive for fatigue (improved).  Cardiovascular:  No chest pain, dyspnea on exertion, edema, orthopnea,  palpitations, paroxysmal nocturnal dyspnea. Dermatological: No rash, lesions/masses Respiratory: No cough, dyspnea Urologic: No hematuria, dysuria Abdominal:   No nausea, vomiting, diarrhea, bright red blood per rectum, melena, or hematemesis Neurologic:  No visual changes, wkns, changes in mental status.  All other systems reviewed and are otherwise negative except as noted above.   Physical Exam:    VS:  BP 118/82   Pulse 80   Ht 5\' 6"  (1.676 m)   Wt (!) 310 lb 6.4 oz (140.8 kg)   SpO2 96%  BMI 50.10 kg/m    General: Well developed, obese [name] appearing in no acute distress. Head: Normocephalic, atraumatic. Neck: No carotid bruits. JVD not elevated.  Lungs: Respirations regular and unlabored, without wheezes or rales.  Heart: Regular rate and rhythm. No S3 or S4.  No murmur, no rubs, or gallops appreciated. Abdomen: Appears non-distended. No obvious abdominal masses. Msk:  Strength and tone appear normal for age. No obvious joint deformities or effusions. Extremities: No clubbing or cyanosis. Trace ankle edema bilaterally.  Distal pedal pulses are 2+ bilaterally. Neuro: Alert and oriented X 3. Moves all extremities spontaneously. No focal deficits noted. Psych:  Responds to questions appropriately with a normal affect. Skin: No rashes or lesions noted  Wt Readings from Last 3 Encounters:  01/04/20 (!) 310 lb 6.4 oz (140.8 kg)  12/13/19 (!) 310 lb (140.6 kg)  12/12/19 (!) 310 lb (140.6 kg)     Studies/Labs Reviewed:   EKG:  EKG is not ordered today.   Recent Labs: 11/27/2019: B Natriuretic Peptide 390.0 11/28/2019: ALT 27; TSH 2.054 12/02/2019: BUN 25; Creatinine, Ser 1.19; Hemoglobin 12.2; Magnesium 1.9; Platelets 200; Potassium 3.8; Sodium 140   Lipid Panel    Component Value Date/Time   CHOL  09/05/2010 0615    154        ATP III CLASSIFICATION:  <200     mg/dL   Desirable  200-239  mg/dL   Borderline High  >=240    mg/dL   High          TRIG 167 (H)  09/05/2010 0615   HDL 35 (L) 09/05/2010 0615   CHOLHDL 4.4 09/05/2010 0615   VLDL 33 09/05/2010 0615   LDLCALC  09/05/2010 0615    86        Total Cholesterol/HDL:CHD Risk Coronary Heart Disease Risk Table                     Men   Women  1/2 Average Risk   3.4   3.3  Average Risk       5.0   4.4  2 X Average Risk   9.6   7.1  3 X Average Risk  23.4   11.0        Use the calculated Patient Ratio above and the CHD Risk Table to determine the patient's CHD Risk.        ATP III CLASSIFICATION (LDL):  <100     mg/dL   Optimal  100-129  mg/dL   Near or Above                    Optimal  130-159  mg/dL   Borderline  160-189  mg/dL   High  >190     mg/dL   Very High    Additional studies/ records that were reviewed today include:   Limited Echo: 11/2019 IMPRESSIONS    1. Left ventricular ejection fraction, by estimation, is 40 to 45%. The  left ventricle has mildly decreased function. The left ventricle  demonstrates global hypokinesis. Left ventricular diastolic parameters are  consistent with Grade II diastolic  dysfunction (pseudonormalization).  2. Right ventricular systolic function is normal. The right ventricular  size is normal. There is normal pulmonary artery systolic pressure.  3. The inferior vena cava is dilated in size with >50% respiratory  variability, suggesting right atrial pressure of 8 mmHg.  4. Limited study to evaluate LV and RV function.   Assessment:  1. Andrew [name] [name] (supraventricular tachycardia) ([place])   2. Chronic combined systolic and diastolic heart failure ([place])   3. Secondary cardiomyopathy ([place])   4. Essential hypertension   5. Hyperlipidemia LDL goal <70   6. [name] (obstructive sleep apnea)      Plan:   In order of problems listed above:  1. Andrew [name] [name] - He was hospitalized for recurrent tachycardia in 11/2019 and this was felt to be most consistent with long RP tachycardia with some AVNRT. Beta-blocker therapy was further titrated and he denies  any recurrent symptoms since. He is now being followed by Andrew [name] [name] and conservative therapy has been recommended unless he develops recurrent arrhythmias. Continue current medication regimen with Coreg 37.5 mg twice daily.   2. Chronic Combined Systolic and Diastolic CHF/Secondary Cardiomyopathy - His EF was previously 55 to 60% by echocardiogram in 08/2019 but found to be reduced at 40 to 45% by repeat imaging in 11/2019. This was felt to possibly be secondary to his tachycardia at that time. - He denies any recent orthopnea, PND or lower extremity edema. Weight has been stable on his home scales. Will plan to obtain a repeat limited echocardiogram in 2 months for reassessment of his EF. - Continue current medication regimen with Coreg 37.5 mg twice daily, Lasix 60 mg daily, Losartan 100 mg daily and Spironolactone 25 mg daily. He does report having recent labs by his PCP and I will request a copy of these.  3. Andrew [name] [name] - BP is well controlled at 118/82 during today's visit. Continue current medication regimen.  4. Andrew [name] [name] - This has been followed by his PCP and LDL was at 65 when checked in 07/2019. He remains on Atorvastatin 40 mg daily.  5. [name] - He is being followed by Pulmonology with plans for BiPAP by review of notes.   Medication Adjustments/Labs and Tests Ordered: Current medicines are reviewed at length with the patient today.  Concerns regarding medicines are outlined above.  Medication changes, Labs and Tests ordered today are listed in the Patient Instructions below. Patient Instructions  Medication Instructions:  Your physician recommends that you continue on your current medications as directed. Please refer to the Current Medication list given to you today.  *If you need a refill on your cardiac medications before your next appointment, please call your pharmacy*   Lab Work: NONE   If you have labs (blood work) drawn today and your tests are completely normal, you will receive  your results only by: Marland Kitchen MyChart Message (if you have MyChart) OR . A paper copy in the mail If you have any lab test that is abnormal or we need to change your treatment, we will call you to review the results.   Testing/Procedures: Your physician has requested that you have an echocardiogram. Echocardiography is a painless test that uses sound waves to create images of your heart. It provides your doctor with information about the size and shape of your heart and how well your heart's chambers and valves are working. This procedure takes approximately one hour. There are no restrictions for this procedure.     Follow-Up: At [facility], you and your health needs are our priority.  As part of our continuing mission to provide you with exceptional heart care, we have created designated Provider Care Teams.  These Care Teams include your primary Cardiologist (physician) and Advanced Practice Providers (APPs -  Physician Assistants and Nurse Practitioners) who all work together to provide you with the care you need, when  you need it.  We recommend signing up for the patient portal called "MyChart".  Sign up information is provided on this After Visit Summary.  MyChart is used to connect with patients for Virtual Visits (Telemedicine).  Patients are able to view lab/test results, encounter notes, upcoming appointments, etc.  Non-urgent messages can be sent to your provider as well.   To learn more about what you can do with MyChart, go to [email or web address].    Your next appointment:   3-4 month(s)  The format for your next appointment:   In Person  Provider:   Rozann Lesches, MD or Bernerd Pho, PA-C   Other Instructions Thank you for choosing [facility]!       Signed, Erma Heritage, PA-C  01/04/2020 5:41 PM    [place] [address] [place], [place] [address] Phone: [phone] Fax: [phone]

## 2020-01-04 NOTE — Patient Instructions (Signed)
Medication Instructions:  Your physician recommends that you continue on your current medications as directed. Please refer to the Current Medication list given to you today.  *If you need a refill on your cardiac medications before your next appointment, please call your pharmacy*   Lab Work: NONE   If you have labs (blood work) drawn today and your tests are completely normal, you will receive your results only by: Marland Kitchen MyChart Message (if you have MyChart) OR . A paper copy in the mail If you have any lab test that is abnormal or we need to change your treatment, we will call you to review the results.   Testing/Procedures: Your physician has requested that you have an echocardiogram. Echocardiography is a painless test that uses sound waves to create images of your heart. It provides your doctor with information about the size and shape of your heart and how well your heart's chambers and valves are working. This procedure takes approximately one hour. There are no restrictions for this procedure.     Follow-Up: At [facility], you and your health needs are our priority.  As part of our continuing mission to provide you with exceptional heart care, we have created designated Provider Care Teams.  These Care Teams include your primary Cardiologist (physician) and Advanced Practice Providers (APPs -  Physician Assistants and Nurse Practitioners) who all work together to provide you with the care you need, when you need it.  We recommend signing up for the patient portal called "MyChart".  Sign up information is provided on this After Visit Summary.  MyChart is used to connect with patients for Virtual Visits (Telemedicine).  Patients are able to view lab/test results, encounter notes, upcoming appointments, etc.  Non-urgent messages can be sent to your provider as well.   To learn more about what you can do with MyChart, go to [email or web address].    Your next appointment:   3-4  month(s)  The format for your next appointment:   In Person  Provider:   Rozann Lesches, MD or Bernerd Pho, PA-C   Other Instructions Thank you for choosing [facility]!

## 2020-01-09 ENCOUNTER — Other Ambulatory Visit: Payer: Self-pay | Admitting: Pulmonary Disease

## 2020-01-09 NOTE — Telephone Encounter (Signed)
Order for bipap placed in chart

## 2020-03-05 ENCOUNTER — Other Ambulatory Visit: Payer: Self-pay

## 2020-03-05 ENCOUNTER — Ambulatory Visit (HOSPITAL_COMMUNITY)
Admission: RE | Admit: 2020-03-05 | Discharge: 2020-03-05 | Disposition: A | Discharge status: Home / Self Care | Payer: Medicare HMO | Source: Ambulatory Visit | Attending: Student | Admitting: Student

## 2020-03-05 DIAGNOSIS — I10 Essential (primary) hypertension: Secondary | ICD-10-CM | POA: Insufficient documentation

## 2020-03-05 DIAGNOSIS — E119 Type 2 diabetes mellitus without complications: Secondary | ICD-10-CM | POA: Diagnosis not present

## 2020-03-05 DIAGNOSIS — E669 Obesity, unspecified: Secondary | ICD-10-CM | POA: Diagnosis not present

## 2020-03-05 DIAGNOSIS — I429 Cardiomyopathy, unspecified: Secondary | ICD-10-CM | POA: Diagnosis present

## 2020-03-05 DIAGNOSIS — E785 Hyperlipidemia, unspecified: Secondary | ICD-10-CM | POA: Insufficient documentation

## 2020-03-05 DIAGNOSIS — G4730 Sleep apnea, unspecified: Secondary | ICD-10-CM | POA: Diagnosis not present

## 2020-03-05 DIAGNOSIS — K219 Gastro-esophageal reflux disease without esophagitis: Secondary | ICD-10-CM | POA: Diagnosis not present

## 2020-03-05 DIAGNOSIS — I4891 Unspecified atrial fibrillation: Secondary | ICD-10-CM | POA: Insufficient documentation

## 2020-03-05 DIAGNOSIS — I428 Other cardiomyopathies: Secondary | ICD-10-CM | POA: Diagnosis not present

## 2020-03-05 LAB — ECHOCARDIOGRAM LIMITED
Area-P 1/2: 2.91 cm2
Calc EF: 57.7 %
S' Lateral: 4.16 cm
Single Plane A2C EF: 63.3 %
Single Plane A4C EF: 56.6 %

## 2020-03-05 NOTE — Progress Notes (Addendum)
*  PRELIMINARY RESULTS* Echocardiogram Limited 2-D Echocardiogram has been performed.  [name] 03/05/2020, 9:20 AM

## 2020-03-07 ENCOUNTER — Encounter: Payer: Self-pay | Admitting: *Deleted

## 2020-04-09 ENCOUNTER — Other Ambulatory Visit: Payer: Self-pay

## 2020-04-09 ENCOUNTER — Encounter: Payer: Self-pay | Admitting: Student

## 2020-04-09 ENCOUNTER — Ambulatory Visit: Payer: Medicare HMO | Admitting: Student

## 2020-04-09 VITALS — BP 132/78 | HR 70 | Ht 66.0 in | Wt 323.4 lb

## 2020-04-09 DIAGNOSIS — I471 Supraventricular tachycardia: Secondary | ICD-10-CM | POA: Diagnosis not present

## 2020-04-09 DIAGNOSIS — Z9989 Dependence on other enabling machines and devices: Secondary | ICD-10-CM

## 2020-04-09 DIAGNOSIS — Z79899 Other long term (current) drug therapy: Secondary | ICD-10-CM | POA: Diagnosis not present

## 2020-04-09 DIAGNOSIS — I429 Cardiomyopathy, unspecified: Secondary | ICD-10-CM

## 2020-04-09 DIAGNOSIS — G4733 Obstructive sleep apnea (adult) (pediatric): Secondary | ICD-10-CM

## 2020-04-09 DIAGNOSIS — I10 Essential (primary) hypertension: Secondary | ICD-10-CM

## 2020-04-09 MED ORDER — FUROSEMIDE 40 MG PO TABS: 40.0000 mg | ORAL_TABLET | Freq: Two times a day (BID) | ORAL | 3 refills | Status: DC

## 2020-04-09 NOTE — Progress Notes (Signed)
Cardiology Office Note    Date:  04/09/2020   ID:  TRUETT MCFARLAN, DOB 1961/01/13, MRN [id number]  PCP:  [name], NP  Cardiologist: Rozann Lesches, MD    Chief Complaint  Patient presents with  . Follow-up    3 month visit    History of Present Illness:    [name] is a 59 y.o. 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   He was last examined by myself in 12/2019 following a recent hospitalization for AVNRT. Repeat echo at that time had shown his EF was reduced at 40-45%. Was evaluated by EP and was recommended to continue BB therapy with Coreg 37.5mg  BID. He had followed-up with Dr. Lovena Le and options for ablation were reviewed but he preferred medical therapy. During his visit, he denied any recurrent symptoms and was continued on BB therapy. Was recommended he have a repeat echocardiogram in several months for reassessment of his EF. His echo was obtained in 02/2020 and showed his EF had normalized to 55-60% with no regional WMA.   In talking with the patient today, he reports having a weight gain greater than 10 pounds on his home scales and this has actually increased by 13 pounds on the office scales when compared to his office visit in 12/2019. He reports associated abdominal distention and lower extremity edema. He does report orthopnea but is trying to get use to utilizing his CPAP at night. He reports the pressures were recently adjusted and he hopes this helps. He does have baseline dyspnea on exertion but denies any associated chest pain or palpitations.   Past Medical History:  Diagnosis Date  . Cardiomyopathy ([place])   . Essential hypertension   . GERD (gastroesophageal reflux disease)   . Gout   . Mixed hyperlipidemia   . Paroxysmal atrial fibrillation (HCC)   . Sleep  apnea   . Type 2 diabetes mellitus ([place])     Past Surgical History:  Procedure Laterality Date  . BUNIONECTOMY    . COLONOSCOPY WITH PROPOFOL N/A 12/06/2017   Procedure: COLONOSCOPY WITH PROPOFOL;  Surgeon: Jonathon Bellows, MD;  Location[facility] ENDOSCOPY;  Service: Gastroenterology;  Laterality: N/A;    Current Medications: Outpatient Medications Prior to Visit  Medication Sig Dispense Refill  . allopurinol (ZYLOPRIM) 300 MG tablet Take 1 tablet (300 mg total) by mouth daily. 30 tablet 5  . amLODipine (NORVASC) 10 MG tablet Take 10 mg by mouth daily.    Marland Kitchen aspirin EC 81 MG tablet Take 81 mg by mouth daily.    Marland Kitchen atorvastatin (LIPITOR) 40 MG tablet Take 40 mg by mouth daily.    . carvedilol (COREG) 25 MG tablet Take 1.5 tablets (37.5 mg total) by mouth 2 (two) times daily with a meal. 270 tablet 3  . cloNIDine (CATAPRES) 0.3 MG tablet Take 0.3 mg by mouth 2 (two) times daily.     Marland Kitchen losartan (COZAAR) 100 MG tablet Take 100 mg by mouth daily.    . metFORMIN (GLUCOPHAGE) 1000 MG tablet Take 1,000 mg by mouth 2 (two) times daily.    Marland Kitchen MITIGARE 0.6 MG CAPS     . spironolactone (ALDACTONE) 25 MG tablet Take 25 mg by mouth daily.    . furosemide (LASIX) 20 MG tablet Take 3 tablets (60 mg total) by mouth daily. 270 tablet 3   No  facility-administered medications prior to visit.     Allergies:   Patient has no known allergies.   Social History   Socioeconomic History  . Marital status: Married    Spouse name: Not on file  . Number of children: Not on file  . Years of education: Not on file  . Highest education level: Not on file  Occupational History  . Not on file  Tobacco Use  . Smoking status: Never Smoker  . Smokeless tobacco: Current User    Types: Chew  Vaping Use  . Vaping Use: Never used  Substance and Sexual Activity  . Alcohol use: Yes    Alcohol/week: 21.0 standard drinks    Types: 21 Cans of beer per week    Comment: 2-3 beer dailys  . Drug use: Never  . Sexual  activity: Not on file  Other Topics Concern  . Not on file  Social History Narrative  . Not on file   Social Determinants of Health   Financial Resource Strain:   . Difficulty of Paying Living Expenses: Not on file  Food Insecurity:   . Worried About Charity fundraiser in the Last Year: Not on file  . Ran Out of Food in the Last Year: Not on file  Transportation Needs:   . Lack of Transportation (Medical): Not on file  . Lack of Transportation (Non-Medical): Not on file  Physical Activity:   . Days of Exercise per Week: Not on file  . Minutes of Exercise per Session: Not on file  Stress:   . Feeling of Stress : Not on file  Social Connections:   . Frequency of Communication with Friends and Family: Not on file  . Frequency of Social Gatherings with Friends and Family: Not on file  . Attends Religious Services: Not on file  . Active Member of Clubs or Organizations: Not on file  . Attends Archivist Meetings: Not on file  . Marital Status: Not on file     Family History:  The patient's family history includes Diabetes in his brother and mother; Heart disease in his father; Hypertension in his brother, father, mother, and sister.   Review of Systems:   Please see the history of present illness.     General:  No chills, fever, or night sweats. Positive for weight gain.  Cardiovascular:  No chest pain, palpitations, paroxysmal nocturnal dyspnea. Positive for dyspnea and edema.  Dermatological: No rash, lesions/masses Respiratory: No cough, dyspnea Urologic: No hematuria, dysuria Abdominal:   No nausea, vomiting, diarrhea, bright red blood per rectum, melena, or hematemesis Neurologic:  No visual changes, wkns, changes in mental status. All other systems reviewed and are otherwise negative except as noted above.   Physical Exam:    VS:  BP 132/78   Pulse 70   Ht 5\' 6"  (1.676 m)   Wt (!) 323 lb 6.4 oz (146.7 kg)   SpO2 96%   BMI 52.20 kg/m    General: Well  developed, obese male appearing in no acute distress. Head: Normocephalic, atraumatic. Neck: No carotid bruits. JVD not elevated.  Lungs: Respirations regular and unlabored, without wheezes or rales.  Heart: Regular rate and rhythm. No S3 or S4.  No murmur, no rubs, or gallops appreciated. Abdomen: Appears distended. No obvious abdominal masses. Msk:  Strength and tone appear normal for age. No obvious joint deformities or effusions. Extremities: No clubbing or cyanosis. 1+ pitting edema bilaterally.  Distal pedal pulses are 2+ bilaterally. Neuro: Alert  and oriented X 3. Moves all extremities spontaneously. No focal deficits noted. Psych:  Responds to questions appropriately with a normal affect. Skin: No rashes or lesions noted  Wt Readings from Last 3 Encounters:  04/09/20 (!) 323 lb 6.4 oz (146.7 kg)  01/04/20 (!) 310 lb 6.4 oz (140.8 kg)  12/13/19 (!) 310 lb (140.6 kg)     Studies/Labs Reviewed:   EKG:  EKG is not ordered today.    Recent Labs: 11/27/2019: B Natriuretic Peptide 390.0 11/28/2019: ALT 27; TSH 2.054 12/02/2019: BUN 25; Creatinine, Ser 1.19; Hemoglobin 12.2; Magnesium 1.9; Platelets 200; Potassium 3.8; Sodium 140   Lipid Panel    Component Value Date/Time   CHOL  09/05/2010 0615    154        ATP III CLASSIFICATION:  <200     mg/dL   Desirable  200-239  mg/dL   Borderline High  >=240    mg/dL   High          TRIG 167 (H) 09/05/2010 0615   HDL 35 (L) 09/05/2010 0615   CHOLHDL 4.4 09/05/2010 0615   VLDL 33 09/05/2010 0615   LDLCALC  09/05/2010 0615    86        Total Cholesterol/HDL:CHD Risk Coronary Heart Disease Risk Table                     Men   Women  1/2 Average Risk   3.4   3.3  Average Risk       5.0   4.4  2 X Average Risk   9.6   7.1  3 X Average Risk  23.4   11.0        Use the calculated Patient Ratio above and the CHD Risk Table to determine the patient's CHD Risk.        ATP III CLASSIFICATION (LDL):  <100     mg/dL   Optimal   100-129  mg/dL   Near or Above                    Optimal  130-159  mg/dL   Borderline  160-189  mg/dL   High  >190     mg/dL   Very High    Additional studies/ records that were reviewed today include:   Limited Echo: 11/2019 IMPRESSIONS    1. Left ventricular ejection fraction, by estimation, is 40 to 45%. The  left ventricle has mildly decreased function. The left ventricle  demonstrates global hypokinesis. Left ventricular diastolic parameters are  consistent with Grade II diastolic  dysfunction (pseudonormalization).  2. Right ventricular systolic function is normal. The right ventricular  size is normal. There is normal pulmonary artery systolic pressure.  3. The inferior vena cava is dilated in size with >50% respiratory  variability, suggesting right atrial pressure of 8 mmHg.  4. Limited study to evaluate LV and RV function.    Limited Echo: 02/2020 IMPRESSIONS    1. Left ventricular ejection fraction, by estimation, is 55 to 60%. The  left ventricle has normal function. The left ventricle has no regional  wall motion abnormalities. There is mild left ventricular hypertrophy.  2. Right ventricular systolic function is normal. The right ventricular  size is normal.  3. The inferior vena cava is normal in size with greater than 50%  respiratory variability, suggesting right atrial pressure of 3 mmHg.  4. Limited echo to evaluate LV function   Assessment:  1. SVT (supraventricular tachycardia) ([place])   2. Secondary cardiomyopathy ([place])   3. Medication management   4. Essential hypertension   5. OSA on CPAP      Plan:   In order of problems listed above:  1. AVNRT - He denies any recent palpitations and is maintaining normal sinus rhythm by examination today. He was previously evaluated by EP and he preferred medical therapy at that time versus ablation. Continue Coreg 37.5mg  BID.   2. Secondary Cardiomyopathy - His EF was previously reduced at  40-45% in 11/2019, normalized at 55-60% by most recent imaging as outlined above.  - He has experienced worsening dyspnea on exertion, abdominal distention and lower extremity edema over the past month with a 10+ pound weight gain on his home scales. Denies any dietary indiscretion and is still limiting his fluid intake. He is currently taking Lasix 60 mg daily but reports his urination frequently decreases in the afternoon. I recommended that we titrate his dosing to 80 mg daily but divide this out to 40 mg twice daily for increased urinary effect. Will recheck BNP and BMET in 2 weeks.   3. HTN - BP is well controlled at 132/78 during today's visit. Continue current medication regimen with Amlodipine 10 mg daily, Coreg 37.5 mg twice daily, Clonidine 0.3 mg twice daily, Losartan 100 mg daily and Spironolactone 25 mg daily.  4. OSA - Being followed by Pulmonology and he reports the pressures were recently adjusted. Continued compliance with CPAP was encouraged.    Medication Adjustments/Labs and Tests Ordered: Current medicines are reviewed at length with the patient today.  Concerns regarding medicines are outlined above.  Medication changes, Labs and Tests ordered today are listed in the Patient Instructions below. Patient Instructions  Medication Instructions:  Your physician has recommended you make the following change in your medication:   Increase Lasix to 40 mg Two times Daily   *If you need a refill on your cardiac medications before your next appointment, please call your pharmacy*   Lab Work: Your physician recommends that you return for lab work in: 2 Weeks (04/22/20)   If you have labs (blood work) drawn today and your tests are completely normal, you will receive your results only by: Marland Kitchen MyChart Message (if you have MyChart) OR . A paper copy in the mail If you have any lab test that is abnormal or we need to change your treatment, we will call you to review the  results.   Testing/Procedures: Thank you for choosing [facility]!     Follow-Up: At [facility], you and your health needs are our priority.  As part of our continuing mission to provide you with exceptional heart care, we have created designated Provider Care Teams.  These Care Teams include your primary Cardiologist (physician) and Advanced Practice Providers (APPs -  Physician Assistants and Nurse Practitioners) who all work together to provide you with the care you need, when you need it.  We recommend signing up for the patient portal called "MyChart".  Sign up information is provided on this After Visit Summary.  MyChart is used to connect with patients for Virtual Visits (Telemedicine).  Patients are able to view lab/test results, encounter notes, upcoming appointments, etc.  Non-urgent messages can be sent to your provider as well.   To learn more about what you can do with MyChart, go to [email or web address].    Your next appointment:   4-6 Weeks   The format for your next appointment:  In Person  Provider:   Bernerd Pho, PA-C   Other Instructions Thank you for choosing [facility]!     Signed, Erma Heritage, PA-C  04/09/2020 1:28 PM    [facility] [address] [place], [place] [address] Phone: [phone] Fax: [phone]

## 2020-04-09 NOTE — Patient Instructions (Signed)
Medication Instructions:  Your physician has recommended you make the following change in your medication:   Increase Lasix to 40 mg Two times Daily   *If you need a refill on your cardiac medications before your next appointment, please call your pharmacy*   Lab Work: Your physician recommends that you return for lab work in: 2 Weeks (04/22/20)   If you have labs (blood work) drawn today and your tests are completely normal, you will receive your results only by:  [place] (if you have MyChart) OR  A paper copy in the mail If you have any lab test that is abnormal or we need to change your treatment, we will call you to review the results.   Testing/Procedures: Thank you for choosing [facility]!     Follow-Up: At [facility], you and your health needs are our priority.  As part of our continuing mission to provide you with exceptional heart care, we have created designated Provider Care Teams.  These Care Teams include your primary Cardiologist (physician) and Advanced Practice Providers (APPs -  Physician Assistants and Nurse Practitioners) who all work together to provide you with the care you need, when you need it.  We recommend signing up for the patient portal called "MyChart".  Sign up information is provided on this After Visit Summary.  MyChart is used to connect with patients for Virtual Visits (Telemedicine).  Patients are able to view lab/test results, encounter notes, upcoming appointments, etc.  Non-urgent messages can be sent to your provider as well.   To learn more about what you can do with MyChart, go to [email or web address].    Your next appointment:   4-6 Weeks   The format for your next appointment:   In Person  Provider:   Bernerd Pho, PA-C   Other Instructions Thank you for choosing [facility]!

## 2020-04-23 ENCOUNTER — Other Ambulatory Visit (HOSPITAL_COMMUNITY)
Admission: RE | Admit: 2020-04-23 | Discharge: 2020-04-23 | Disposition: A | Discharge status: Home / Self Care | Payer: Medicare HMO | Source: Ambulatory Visit | Attending: Student | Admitting: Student

## 2020-04-23 ENCOUNTER — Other Ambulatory Visit: Payer: Self-pay

## 2020-04-23 DIAGNOSIS — I429 Cardiomyopathy, unspecified: Secondary | ICD-10-CM | POA: Diagnosis present

## 2020-04-23 DIAGNOSIS — Z79899 Other long term (current) drug therapy: Secondary | ICD-10-CM | POA: Diagnosis present

## 2020-04-23 DIAGNOSIS — I10 Essential (primary) hypertension: Secondary | ICD-10-CM | POA: Diagnosis present

## 2020-04-23 LAB — BASIC METABOLIC PANEL
Anion gap: 10 (ref 5–15)
BUN: 30 mg/dL — ABNORMAL HIGH (ref 6–20)
CO2: 27 mmol/L (ref 22–32)
Calcium: 9.4 mg/dL (ref 8.9–10.3)
Chloride: 100 mmol/L (ref 98–111)
Creatinine, Ser: 1.79 mg/dL — ABNORMAL HIGH (ref 0.61–1.24)
GFR, Estimated: 44 mL/min — ABNORMAL LOW (ref >60)
Glucose, Bld: 150 mg/dL — ABNORMAL HIGH (ref 70–99)
Potassium: 3.7 mmol/L (ref 3.5–5.1)
Sodium: 137 mmol/L (ref 135–145)

## 2020-04-23 LAB — BRAIN NATRIURETIC PEPTIDE: B Natriuretic Peptide: 17 pg/mL (ref 0.0–100.0)

## 2020-04-24 ENCOUNTER — Telehealth: Payer: Self-pay

## 2020-04-24 DIAGNOSIS — Z79899 Other long term (current) drug therapy: Secondary | ICD-10-CM

## 2020-04-24 MED ORDER — FUROSEMIDE 40 MG PO TABS: ORAL_TABLET | ORAL | 3 refills | Status: DC

## 2020-04-24 NOTE — Telephone Encounter (Signed)
Lab results given to patient.he understands to reduce lasix top 60 mg daily and continue to weigh self daily.He may take an extra 20 mg if he has 2 lbs weight gain in 24 hrs over 5 lbs gain in 1 week.He will repeat bmet in 2-3 weeks

## 2020-04-24 NOTE — Telephone Encounter (Signed)
-----   Message from Erma Heritage, [place] sent at 04/23/2020 10:35 AM EST ----- Please let the patient know his lab work shows his fluid level had normalized as this was previously 390 and is now at 17 (normal is less than 100). Electrolytes remain within a normal range. Kidney function has worsened and suspect this is secondary to dehydration and and the change in his diuretic last month. Would reduce Lasix back to 60mg  daily. Would recommend he continue to follow daily weights and take an extra 20mg  tablet if weight gain greater than 2 lbs overnight or 5 lbs in 1 week. Recheck BMET again in 2-3 weeks.

## 2020-05-08 ENCOUNTER — Other Ambulatory Visit: Payer: Self-pay

## 2020-05-08 ENCOUNTER — Other Ambulatory Visit (HOSPITAL_COMMUNITY)
Admission: RE | Admit: 2020-05-08 | Discharge: 2020-05-08 | Disposition: A | Discharge status: Home / Self Care | Payer: Medicare HMO | Source: Ambulatory Visit | Attending: Student | Admitting: Student

## 2020-05-08 DIAGNOSIS — Z79899 Other long term (current) drug therapy: Secondary | ICD-10-CM | POA: Insufficient documentation

## 2020-05-08 LAB — BASIC METABOLIC PANEL
Anion gap: 10 (ref 5–15)
BUN: 22 mg/dL — ABNORMAL HIGH (ref 6–20)
CO2: 26 mmol/L (ref 22–32)
Calcium: 9.4 mg/dL (ref 8.9–10.3)
Chloride: 103 mmol/L (ref 98–111)
Creatinine, Ser: 1.45 mg/dL — ABNORMAL HIGH (ref 0.61–1.24)
GFR, Estimated: 56 mL/min — ABNORMAL LOW (ref >60)
Glucose, Bld: 135 mg/dL — ABNORMAL HIGH (ref 70–99)
Potassium: 3.8 mmol/L (ref 3.5–5.1)
Sodium: 139 mmol/L (ref 135–145)

## 2020-05-09 ENCOUNTER — Telehealth: Payer: Self-pay

## 2020-05-09 NOTE — Telephone Encounter (Signed)
Called patient and relayed results to him per Randall An, PA-C. He verbalized understanding.

## 2020-05-09 NOTE — Telephone Encounter (Signed)
-----   Message from Ellsworth Lennox, [place] sent at 05/08/2020 11:03 AM EST ----- Please let  the patient know his electrolytes remain within a normal range and kidney function has improved with medication adjustments as creatinine was previously elevated to 1.79, now improved to 1.45. Continue current medication regimen.

## 2020-05-16 ENCOUNTER — Encounter: Payer: Self-pay | Admitting: Student

## 2020-05-16 ENCOUNTER — Ambulatory Visit: Payer: Medicare HMO | Admitting: Student

## 2020-05-16 VITALS — BP 140/80 | HR 77 | Ht 67.0 in | Wt 320.0 lb

## 2020-05-16 DIAGNOSIS — I10 Essential (primary) hypertension: Secondary | ICD-10-CM

## 2020-05-16 DIAGNOSIS — I471 Supraventricular tachycardia, unspecified: Secondary | ICD-10-CM

## 2020-05-16 DIAGNOSIS — R0609 Other forms of dyspnea: Secondary | ICD-10-CM

## 2020-05-16 DIAGNOSIS — I429 Cardiomyopathy, unspecified: Secondary | ICD-10-CM | POA: Diagnosis not present

## 2020-05-16 DIAGNOSIS — G4733 Obstructive sleep apnea (adult) (pediatric): Secondary | ICD-10-CM

## 2020-05-16 DIAGNOSIS — R0600 Dyspnea, unspecified: Secondary | ICD-10-CM | POA: Diagnosis not present

## 2020-05-16 NOTE — Patient Instructions (Signed)
Medication Instructions:  No changes *If you need a refill on your cardiac medications before your next appointment, please call your pharmacy*   Lab Work: None If you have labs (blood work) drawn today and your tests are completely normal, you will receive your results only by: Marland Kitchen MyChart Message (if you have MyChart) OR . A paper copy in the mail If you have any lab test that is abnormal or we need to change your treatment, we will call you to review the results.   Testing/Procedures: None-Call back if you decide to have stress test and we will schedule   Follow-Up: At [facility], you and your health needs are our priority.  As part of our continuing mission to provide you with exceptional heart care, we have created designated Provider Care Teams.  These Care Teams include your primary Cardiologist (physician) and Advanced Practice Providers (APPs -  Physician Assistants and Nurse Practitioners) who all work together to provide you with the care you need, when you need it.  We recommend signing up for the patient portal called "MyChart".  Sign up information is provided on this After Visit Summary.  MyChart is used to connect with patients for Virtual Visits (Telemedicine).  Patients are able to view lab/test results, encounter notes, upcoming appointments, etc.  Non-urgent messages can be sent to your provider as well.   To learn more about what you can do with MyChart, go to [email or web address].    Your next appointment:   Keep follow up appt with Dr Ladona Ridgel   The format for your next appointment:   06/25/2020 8:45 AM  Provider:   Lewayne Bunting, MD   Other Instructions Call back if you decide to have stress test and we will schedule

## 2020-05-16 NOTE — Progress Notes (Signed)
Cardiology Office Note    Date:  05/16/2020   ID:  SOREN LAZARZ, DOB 1960-09-19, MRN [id number]  PCP:  Martie Round, NP  Cardiologist: Nona Dell, MD    Chief Complaint  Patient presents with   Follow-up    1 month visit    History of Present Illness:    [name] is a 60 y.o. 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   He was last examined by myself in 03/2020 and reported a 10+ lb weight gain on his home scales and worsening abdominal distension and lower extremity edema. He was taking Lasix 60mg  daily and this was increased to 40mg  BID. Follow-up labs on 12/14 showed his BNP was normal at 17. Creatinine has increased to 1.79, therefore Lasix was reduced to 60mg  daily. Follow-up labs on 12/29 showed creatinine had improved to 1.45 and electrolytes were within a normal range.   In talking with the patient today, he reports still having dyspnea on exertion which has been stable for the past 6+ months. He does not feel like he noticed a change in this following improvement of his EF. He denies any specific palpitations. He does report having chest discomfort at night when trying to apply his CPAP but denies any exertional chest discomfort during the day. No recent orthopnea or PND. He continues to have some abdominal bloating and has scheduled follow-up with his PCP later this month to see if he needs a GI referral. His weight has decreased by 3 pounds since his last visit.   Past Medical History:  Diagnosis Date   Cardiomyopathy [facility])    Essential hypertension    GERD (gastroesophageal reflux disease)    Gout    Mixed hyperlipidemia    Paroxysmal atrial fibrillation (HCC)    Sleep apnea    Type 2 diabetes mellitus (HCC)     Past Surgical History:  Procedure Laterality Date    BUNIONECTOMY     COLONOSCOPY WITH PROPOFOL N/A 12/06/2017   Procedure: COLONOSCOPY WITH PROPOFOL;  Surgeon: 1/30, MD;  Location: [facility] ENDOSCOPY;  Service: Gastroenterology;  Laterality: N/A;    Current Medications: Outpatient Medications Prior to Visit  Medication Sig Dispense Refill   allopurinol (ZYLOPRIM) 300 MG tablet Take 1 tablet (300 mg total) by mouth daily. 30 tablet 5   amLODipine (NORVASC) 10 MG tablet Take 10 mg by mouth daily.     aspirin EC 81 MG tablet Take 81 mg by mouth daily.     atorvastatin (LIPITOR) 40 MG tablet Take 40 mg by mouth daily.     carvedilol (COREG) 25 MG tablet Take 1.5 tablets (37.5 mg total) by mouth 2 (two) times daily with a meal. 270 tablet 3   cloNIDine (CATAPRES) 0.3 MG tablet Take 0.3 mg by mouth 2 (two) times daily.      furosemide (LASIX) 40 MG tablet Take 40 mg am and 20 mg pm 135 tablet 3   losartan (COZAAR) 100 MG tablet Take 100 mg by mouth daily.     metFORMIN (GLUCOPHAGE) 1000 MG tablet Take 1,000 mg by mouth 2 (two) times daily.     MITIGARE 0.6 MG CAPS      spironolactone (ALDACTONE) 25 MG tablet Take 25 mg by mouth daily.     No facility-administered medications prior to visit.  Allergies:   Patient has no known allergies.   Social History   Socioeconomic History   Marital status: Married    Spouse name: Not on file   Number of children: Not on file   Years of education: Not on file   Highest education level: Not on file  Occupational History   Not on file  Tobacco Use   Smoking status: Never Smoker   Smokeless tobacco: Current User    Types: Chew  Vaping Use   Vaping Use: Never used  Substance and Sexual Activity   Alcohol use: Yes    Alcohol/week: 21.0 standard drinks    Types: 21 Cans of beer per week    Comment: 2-3 beer dailys   Drug use: Never   Sexual activity: Not on file  Other Topics Concern   Not on file  Social History Narrative   Not on file   Social Determinants of  Health   Financial Resource Strain: Not on file  Food Insecurity: Not on file  Transportation Needs: Not on file  Physical Activity: Not on file  Stress: Not on file  Social Connections: Not on file     Family History:  The patient's family history includes Diabetes in his brother and mother; Heart disease in his father; Hypertension in his brother, father, mother, and sister.   Review of Systems:   Please see the history of present illness.     General:  No chills, fever, night sweats or weight changes.  Cardiovascular:  No exertional chest pain, orthopnea, palpitations, paroxysmal nocturnal dyspnea. Positive for dyspnea on exertion.  Dermatological: No rash, lesions/masses Respiratory: No cough, dyspnea Urologic: No hematuria, dysuria Abdominal:   No nausea, vomiting, diarrhea, bright red blood per rectum, melena, or hematemesis. Positive for abdominal distension.  Neurologic:  No visual changes, wkns, changes in mental status. All other systems reviewed and are otherwise negative except as noted above.   Physical Exam:    VS:  BP 140/80    Pulse 77    Ht 5\' 7"  (1.702 m)    Wt (!) 320 lb (145.2 kg)    SpO2 95%    BMI 50.12 kg/m    General: Pleasant, obese male appearing in no acute distress. Head: Normocephalic, atraumatic. Neck: No carotid bruits. JVD not elevated.  Lungs: Respirations regular and unlabored, without wheezes or rales.  Heart: Regular rate and rhythm. No S3 or S4.  No murmur, no rubs, or gallops appreciated. Abdomen: Appears non-distended. No obvious abdominal masses. Msk:  Strength and tone appear normal for age. No obvious joint deformities or effusions. Extremities: No clubbing or cyanosis. Trace lower extremity edema bilaterally.  Distal pedal pulses are 2+ bilaterally. Neuro: Alert and oriented X 3. Moves all extremities spontaneously. No focal deficits noted. Psych:  Responds to questions appropriately with a normal affect. Skin: No rashes or lesions  noted  Wt Readings from Last 3 Encounters:  05/16/20 (!) 320 lb (145.2 kg)  04/09/20 (!) 323 lb 6.4 oz (146.7 kg)  01/04/20 (!) 310 lb 6.4 oz (140.8 kg)     Studies/Labs Reviewed:   EKG:  EKG is not ordered today.   Recent Labs: 11/28/2019: ALT 27; TSH 2.054 12/02/2019: Hemoglobin 12.2; Magnesium 1.9; Platelets 200 04/23/2020: B Natriuretic Peptide 17.0 05/08/2020: BUN 22; Creatinine, Ser 1.45; Potassium 3.8; Sodium 139   Lipid Panel    Component Value Date/Time   CHOL  09/05/2010 0615    154        ATP III  CLASSIFICATION:  <200     mg/dL   Desirable  200-239  mg/dL   Borderline High  >=240    mg/dL   High          TRIG 167 (H) 09/05/2010 0615   HDL 35 (L) 09/05/2010 0615   CHOLHDL 4.4 09/05/2010 0615   VLDL 33 09/05/2010 0615   LDLCALC  09/05/2010 0615    86        Total Cholesterol/HDL:CHD Risk Coronary Heart Disease Risk Table                     Men   Women  1/2 Average Risk   3.4   3.3  Average Risk       5.0   4.4  2 X Average Risk   9.6   7.1  3 X Average Risk  23.4   11.0        Use the calculated Patient Ratio above and the CHD Risk Table to determine the patient's CHD Risk.        ATP III CLASSIFICATION (LDL):  <100     mg/dL   Optimal  100-129  mg/dL   Near or Above                    Optimal  130-159  mg/dL   Borderline  160-189  mg/dL   High  >190     mg/dL   Very High    Additional studies/ records that were reviewed today include:   Limited Echo: 03/05/2020 IMPRESSIONS    1. Left ventricular ejection fraction, by estimation, is 55 to 60%. The  left ventricle has normal function. The left ventricle has no regional  wall motion abnormalities. There is mild left ventricular hypertrophy.  2. Right ventricular systolic function is normal. The right ventricular  size is normal.  3. The inferior vena cava is normal in size with greater than 50%  respiratory variability, suggesting right atrial pressure of 3 mmHg.  4. Limited echo to  evaluate LV function   Assessment:    1. Dyspnea on exertion   2. SVT (supraventricular tachycardia) ([place])   3. Secondary cardiomyopathy ([place])   4. Essential hypertension   5. OSA (obstructive sleep apnea)      Plan:   In order of problems listed above:  1. Dyspnea on Exertion - He reports symptoms for the past 6+ months and feels like his dyspnea did not improve following normalization of his EF. He denies any prior tobacco use so COPD is less likely. We reviewed a possible stress test for ischemic evaluation but he wishes to hold off on this for now. Was encouraged to call back if he wishes to schedule this. I also suspect deconditioning is playing a role as he is morbidly obese and reports being sedentary most of the day and increased physical activity was encouraged.   2. SVT - Symptoms have overall been well-controlled and he denies any persistent symptoms. Continue Coreg 37.5mg  BID.   3. Secondary Cardiomyopathy - His EF was previously reduced at 40-45% in 11/2019, normalized to 55-60% by echo in 02/2020. - Continue Coreg 37.5mg  BID and Losartan 100mg  daily. Will continue Lasix 40mg  in AM/20mg  in PM as his volume status has improved since his last visit and his BNP had normalized by most recent labs. Would not further titrate dosing at this time as he had an AKI with prior titration.   4. HTN - BP is  at 140/80 during today's visit which is improved as compared to prior office visits. Continue current medication regimen with Amlodipine, Coreg, Clonidine, Losartan and Spironolactone.   5. OSA - He is still trying to get use to his CPAP but is currently only able to tolerate this for 1-2 hours at a time.    Medication Adjustments/Labs and Tests Ordered: Current medicines are reviewed at length with the patient today.  Concerns regarding medicines are outlined above.  Medication changes, Labs and Tests ordered today are listed in the Patient Instructions below. Patient  Instructions  Medication Instructions:  No changes *If you need a refill on your cardiac medications before your next appointment, please call your pharmacy*   Lab Work: None If you have labs (blood work) drawn today and your tests are completely normal, you will receive your results only by:  MyChart Message (if you have MyChart) OR  A paper copy in the mail If you have any lab test that is abnormal or we need to change your treatment, we will call you to review the results.   Testing/Procedures: None-Call back if you decide to have stress test and we will schedule   Follow-Up: At [facility], you and your health needs are our priority.  As part of our continuing mission to provide you with exceptional heart care, we have created designated Provider Care Teams.  These Care Teams include your primary Cardiologist (physician) and Advanced Practice Providers (APPs -  Physician Assistants and Nurse Practitioners) who all work together to provide you with the care you need, when you need it.  We recommend signing up for the patient portal called "MyChart".  Sign up information is provided on this After Visit Summary.  MyChart is used to connect with patients for Virtual Visits (Telemedicine).  Patients are able to view lab/test results, encounter notes, upcoming appointments, etc.  Non-urgent messages can be sent to your provider as well.   To learn more about what you can do with MyChart, go to [email or web address].    Your next appointment:   Keep follow up appt with Dr Ladona Ridgel   The format for your next appointment:   06/25/2020 8:45 AM  Provider:   Lewayne Bunting, MD   Other Instructions Call back if you decide to have stress test and we will schedule     Signed, Ellsworth Lennox, PA-C  05/16/2020 7:16 PM     [facility] [address] [place], [place] [address] Phone: [phone] Fax: [phone]

## 2020-06-25 ENCOUNTER — Ambulatory Visit: Payer: Medicare HMO | Admitting: Internal Medicine

## 2020-06-25 ENCOUNTER — Encounter: Payer: Self-pay | Admitting: Internal Medicine

## 2020-06-25 ENCOUNTER — Other Ambulatory Visit: Payer: Self-pay

## 2020-06-25 VITALS — BP 122/84 | HR 72 | Ht 66.0 in | Wt 319.8 lb

## 2020-06-25 DIAGNOSIS — I471 Supraventricular tachycardia: Secondary | ICD-10-CM | POA: Diagnosis not present

## 2020-06-25 NOTE — Patient Instructions (Signed)
Medication Instructions:  °Your physician recommends that you continue on your current medications as directed. Please refer to the Current Medication list given to you today. ° °*If you need a refill on your cardiac medications before your next appointment, please call your pharmacy* ° ° °Lab Work: °NONE  ° °If you have labs (blood work) drawn today and your tests are completely normal, you will receive your results only by: °MyChart Message (if you have MyChart) OR °A paper copy in the mail °If you have any lab test that is abnormal or we need to change your treatment, we will call you to review the results. ° ° °Testing/Procedures: °NONE  ° ° °Follow-Up: °At [facility], you and your health needs are our priority.  As part of our continuing mission to provide you with exceptional heart care, we have created designated Provider Care Teams.  These Care Teams include your primary Cardiologist (physician) and Advanced Practice Providers (APPs -  Physician Assistants and Nurse Practitioners) who all work together to provide you with the care you need, when you need it. ° °We recommend signing up for the patient portal called "MyChart".  Sign up information is provided on this After Visit Summary.  MyChart is used to connect with patients for Virtual Visits (Telemedicine).  Patients are able to view lab/test results, encounter notes, upcoming appointments, etc.  Non-urgent messages can be sent to your provider as well.   °To learn more about what you can do with MyChart, go to [email or web address].   ° °Your next appointment:   ° As Needed  ° °The format for your next appointment:   °In Person ° °Provider:   °Gregg Taylor, MD  ° ° °Other Instructions °Thank you for choosing [facility]! °  ° ° °

## 2020-06-25 NOTE — Progress Notes (Signed)
HPI Mr. Hevia returns today for followup of his SVT. He is a pleasant 60 yo man with a h/o HTN, LV dysfunction and mixed systolic and dIastolic heart failure, obesity and SVT due to a long RP tachycardia. Since I saw him last, his SVT has been quiet. He remains active rabbit hunting. He has class 2 CHF symptoms. His EF has normalized. He is frustrated by his inability to lose weight.   No Known Allergies   Current Outpatient Medications  Medication Sig Dispense Refill  . allopurinol (ZYLOPRIM) 300 MG tablet Take 1 tablet (300 mg total) by mouth daily. 30 tablet 5  . amLODipine (NORVASC) 10 MG tablet Take 10 mg by mouth daily.    Marland Kitchen aspirin EC 81 MG tablet Take 81 mg by mouth daily.    Marland Kitchen atorvastatin (LIPITOR) 40 MG tablet Take 40 mg by mouth daily.    . carvedilol (COREG) 25 MG tablet Take 1.5 tablets (37.5 mg total) by mouth 2 (two) times daily with a meal. 270 tablet 3  . cloNIDine (CATAPRES) 0.3 MG tablet Take 0.3 mg by mouth 2 (two) times daily.     . furosemide (LASIX) 40 MG tablet Take 40 mg am and 20 mg pm 135 tablet 3  . losartan (COZAAR) 100 MG tablet Take 100 mg by mouth daily.    . metFORMIN (GLUCOPHAGE) 1000 MG tablet Take 1,000 mg by mouth 2 (two) times daily.    Marland Kitchen MITIGARE 0.6 MG CAPS     . spironolactone (ALDACTONE) 25 MG tablet Take 25 mg by mouth daily.     No current facility-administered medications for this visit.     Past Medical History:  Diagnosis Date  . Cardiomyopathy ([place])   . Essential hypertension   . GERD (gastroesophageal reflux disease)   . Gout   . Mixed hyperlipidemia   . Paroxysmal atrial fibrillation (HCC)   . Sleep apnea   . Type 2 diabetes mellitus (HCC)     ROS:   All systems reviewed and negative except as noted in the HPI.   Past Surgical History:  Procedure Laterality Date  . BUNIONECTOMY    . COLONOSCOPY WITH PROPOFOL N/A 12/06/2017   Procedure: COLONOSCOPY WITH PROPOFOL;  Surgeon: Jonathon Bellows, MD;  Location: [facility]  ENDOSCOPY;  Service: Gastroenterology;  Laterality: N/A;     Family History  Problem Relation Age of Onset  . Hypertension Mother   . Diabetes Mother   . Heart disease Father   . Hypertension Father   . Hypertension Sister   . Hypertension Brother   . Diabetes Brother      Social History   Socioeconomic History  . Marital status: Married    Spouse name: Not on file  . Number of children: Not on file  . Years of education: Not on file  . Highest education level: Not on file  Occupational History  . Not on file  Tobacco Use  . Smoking status: Never Smoker  . Smokeless tobacco: Current User    Types: Chew  Vaping Use  . Vaping Use: Never used  Substance and Sexual Activity  . Alcohol use: Yes    Alcohol/week: 21.0 standard drinks    Types: 21 Cans of beer per week    Comment: 2-3 beer dailys  . Drug use: Never  . Sexual activity: Not on file  Other Topics Concern  . Not on file  Social History Narrative  . Not on file   Social Determinants  of Health   Financial Resource Strain: Not on file  Food Insecurity: Not on file  Transportation Needs: Not on file  Physical Activity: Not on file  Stress: Not on file  Social Connections: Not on file  Intimate Partner Violence: Not on file     BP 122/84   Pulse 72   Ht 5\' 6"  (1.676 m)   Wt (!) 319 lb 12.8 oz (145.1 kg)   SpO2 96%   BMI 51.62 kg/m   Physical Exam:  Obese but well appearing middle aged man, NAD HEENT: Unremarkable Neck:  No JVD, no thyromegally Lymphatics:  No adenopathy Back:  No CVA tenderness Lungs:  Clear with no wheezes HEART:  Regular rate rhythm, no murmurs, no rubs, no clicks Abd:  soft, positive bowel sounds, no organomegally, no rebound, no guarding Ext:  2 plus pulses, no edema, no cyanosis, no clubbing Skin:  No rashes no nodules Neuro:  CN II through XII intact, motor grossly intact  Assess/Plan: 1. SVT - His symptoms are well controlled on coreg. He will undergo watchful  waiting. 2. Obesity - I encouraged him to lose weight. 3. Mixed CHF - his symptoms are class 2. His EF has normalized on medical therapy. No change in meds. 4. HTN - his bp is well controlled. Weight loss is encouraged.  Carleene Overlie Revella Shelton,MD

## 2021-03-12 ENCOUNTER — Other Ambulatory Visit: Payer: Self-pay | Admitting: Student

## 2021-03-13 ENCOUNTER — Other Ambulatory Visit: Payer: Self-pay

## 2021-03-13 MED ORDER — FUROSEMIDE 40 MG PO TABS: ORAL_TABLET | ORAL | 3 refills | Status: DC

## 2021-03-13 NOTE — Telephone Encounter (Signed)
Refilled Lasix 40 mg am and 20 mg pm to [facility]

## 2021-06-12 IMAGING — DX DG CHEST 1V PORT
1 series · 1 of 1 positions shown · non-contrast
Comparison: 09/03/2010

CLINICAL DATA: Shortness of breath and dry cough

EXAM:
PORTABLE CHEST 1 VIEW

[chest ap]
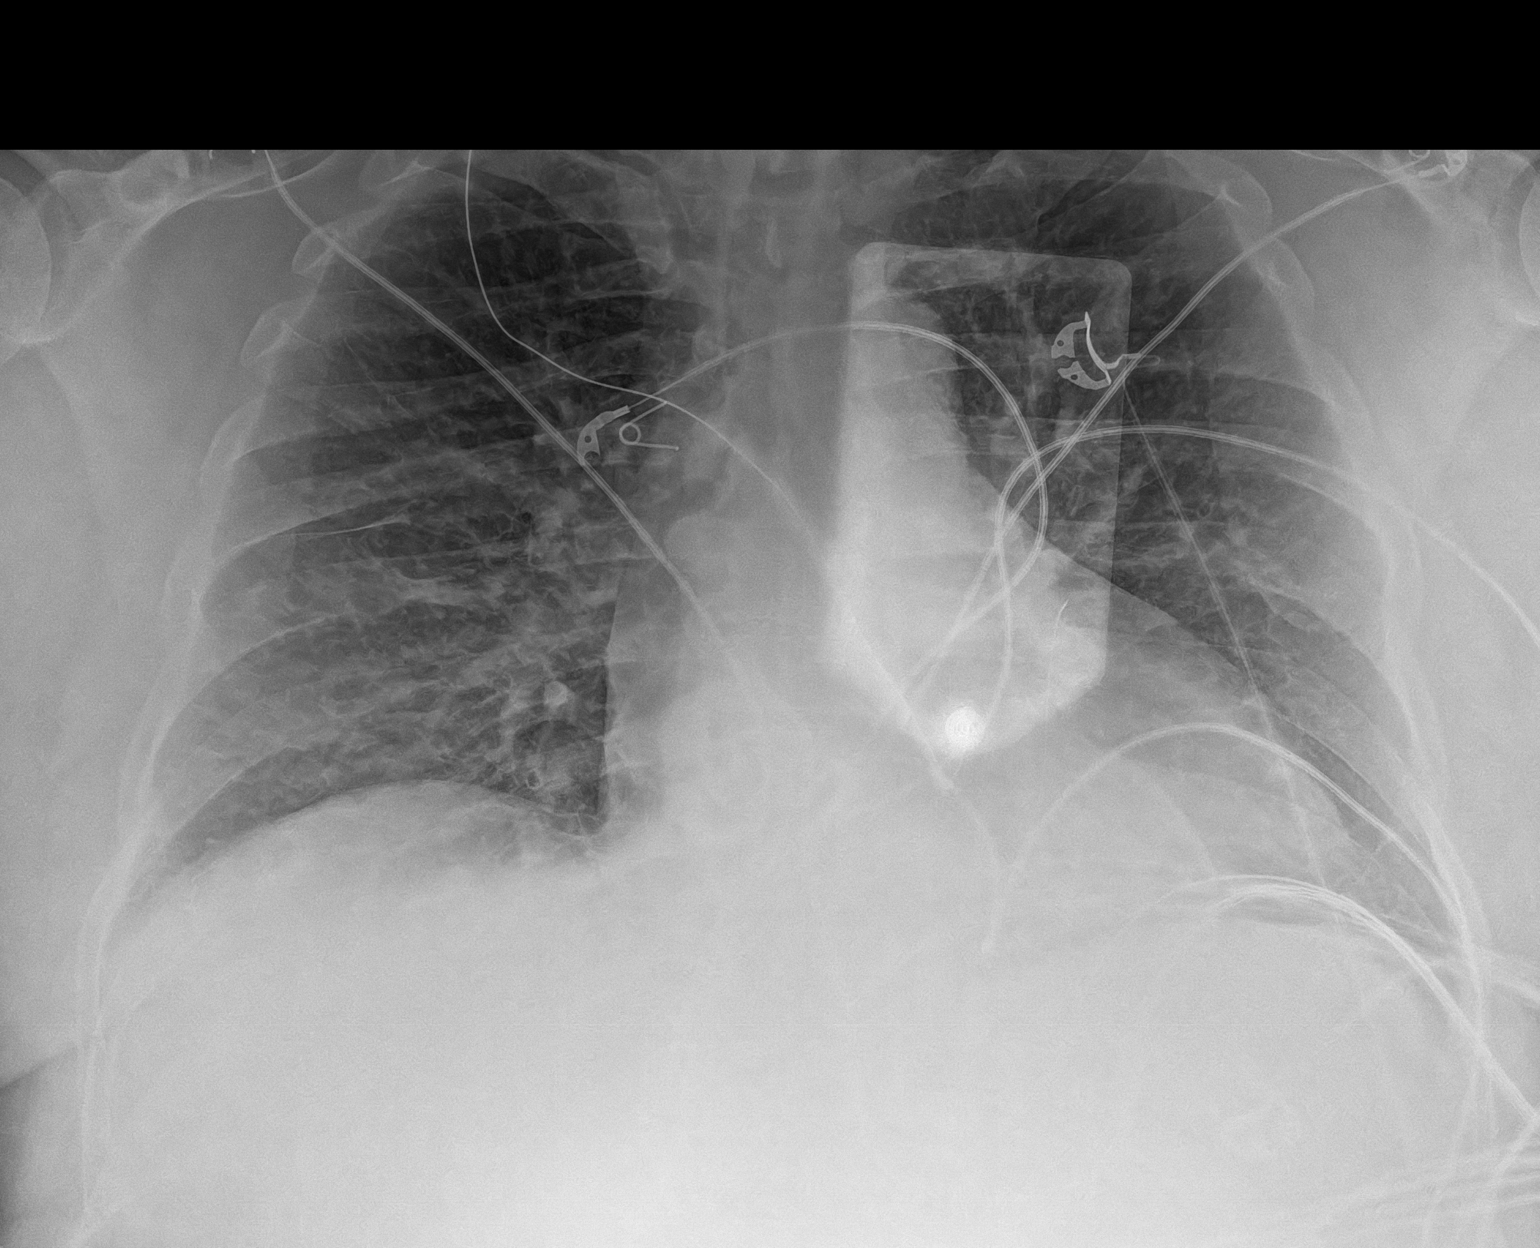

[1 of 1 positions shown; findings below may reference images not displayed]

FINDINGS: Cardiac shadow is stable. Lungs are well aerated bilaterally.
Increased central vascular congestion is noted with mild
interstitial edema. No sizable effusion is noted. No bony
abnormality is seen.
IMPRESSION: Changes of mild CHF with edema.

## 2021-11-05 ENCOUNTER — Ambulatory Visit: Payer: Medicare HMO | Admitting: Internal Medicine

## 2021-11-05 ENCOUNTER — Ambulatory Visit (INDEPENDENT_AMBULATORY_CARE_PROVIDER_SITE_OTHER): Payer: Medicare PPO | Admitting: Internal Medicine

## 2021-11-05 ENCOUNTER — Encounter: Payer: Self-pay | Admitting: Internal Medicine

## 2021-11-05 VITALS — BP 152/98 | HR 60 | Ht 67.0 in | Wt 318.0 lb

## 2021-11-05 DIAGNOSIS — I471 Supraventricular tachycardia: Secondary | ICD-10-CM | POA: Diagnosis not present

## 2021-11-05 NOTE — Patient Instructions (Signed)
Medication Instructions:  Your physician recommends that you continue on your current medications as directed. Please refer to the Current Medication list given to you today.  *If you need a refill on your cardiac medications before your next appointment, please call your pharmacy*   Lab Work: NONE   If you have labs (blood work) drawn today and your tests are completely normal, you will receive your results only by: MyChart Message (if you have MyChart) OR A paper copy in the mail If you have any lab test that is abnormal or we need to change your treatment, we will call you to review the results.   Testing/Procedures: NONE    Follow-Up: At [facility], you and your health needs are our priority.  As part of our continuing mission to provide you with exceptional heart care, we have created designated Provider Care Teams.  These Care Teams include your primary Cardiologist (physician) and Advanced Practice Providers (APPs -  Physician Assistants and Nurse Practitioners) who all work together to provide you with the care you need, when you need it.  We recommend signing up for the patient portal called "MyChart".  Sign up information is provided on this After Visit Summary.  MyChart is used to connect with patients for Virtual Visits (Telemedicine).  Patients are able to view lab/test results, encounter notes, upcoming appointments, etc.  Non-urgent messages can be sent to your provider as well.   To learn more about what you can do with MyChart, go to [email or web address].    Your next appointment:   1 year(s)  The format for your next appointment:   In Person  Provider:   Gregg Taylor, MD    Other Instructions Thank you for choosing [facility]!    Important Information About Sugar       

## 2021-11-05 NOTE — Progress Notes (Signed)
HPI [name] returns today for followup of his SVT. He is a pleasant 61 yo man with a h/o HTN, LV dysfunction and mixed systolic and dIastolic heart failure, obesity and SVT due to a long RP tachycardia. Since I saw him last, his SVT has been quiet. He remains active rabbit and deer hunting. He has class 2 CHF symptoms. His EF has normalized. He is frustrated by his inability to lose weight.   No Known Allergies   Current Outpatient Medications  Medication Sig Dispense Refill   allopurinol (ZYLOPRIM) 300 MG tablet Take 1 tablet (300 mg total) by mouth daily. 30 tablet 5   amLODipine (NORVASC) 10 MG tablet Take 10 mg by mouth daily.     aspirin EC 81 MG tablet Take 81 mg by mouth daily.     atorvastatin (LIPITOR) 40 MG tablet Take 40 mg by mouth daily.     carvedilol (COREG) 25 MG tablet Take 1.5 tablets (37.5 mg total) by mouth 2 (two) times daily with a meal. 270 tablet 3   cloNIDine (CATAPRES) 0.3 MG tablet Take 0.3 mg by mouth 2 (two) times daily.      FARXIGA 10 MG TABS tablet Take 10 mg by mouth daily.     furosemide (LASIX) 40 MG tablet Take 40 mg am and 20 mg pm 135 tablet 3   losartan (COZAAR) 100 MG tablet Take 100 mg by mouth daily.     metFORMIN (GLUCOPHAGE) 1000 MG tablet Take 1,000 mg by mouth 2 (two) times daily.     MITIGARE 0.6 MG CAPS      polyethylene glycol (MIRALAX / GLYCOLAX) 17 g packet Take 17 g by mouth daily.     spironolactone (ALDACTONE) 25 MG tablet Take 25 mg by mouth daily.     No current facility-administered medications for this visit.     Past Medical History:  Diagnosis Date   Cardiomyopathy [name]    Essential hypertension    GERD (gastroesophageal reflux disease)    Gout    Mixed hyperlipidemia    Paroxysmal atrial fibrillation (HCC)    Sleep apnea    Type 2 diabetes mellitus ([name]     ROS:   All systems reviewed and negative except as noted in the HPI.   Past Surgical History:  Procedure Laterality Date   BUNIONECTOMY      COLONOSCOPY WITH PROPOFOL N/A 12/06/2017   Procedure: COLONOSCOPY WITH PROPOFOL;  Surgeon: Jonathon Bellows, MD;  Location: [facility] ENDOSCOPY;  Service: Gastroenterology;  Laterality: N/A;     Family History  Problem Relation Age of Onset   Hypertension Mother    Diabetes Mother    Heart disease Father    Hypertension Father    Hypertension Sister    Hypertension Brother    Diabetes Brother      Social History   Socioeconomic History   Marital status: Married    Spouse name: Not on file   Number of children: Not on file   Years of education: Not on file   Highest education level: Not on file  Occupational History   Not on file  Tobacco Use   Smoking status: Never   Smokeless tobacco: Current    Types: Chew  Vaping Use   Vaping Use: Never used  Substance and Sexual Activity   Alcohol use: Yes    Alcohol/week: 21.0 standard drinks of alcohol    Types: 21 Cans of beer per week    Comment: 2-3 beer  dailys   Drug use: Never   Sexual activity: Not on file  Other Topics Concern   Not on file  Social History Narrative   Not on file   Social Determinants of Health   Financial Resource Strain: Not on file  Food Insecurity: Not on file  Transportation Needs: Not on file  Physical Activity: Not on file  Stress: Not on file  Social Connections: Not on file  Intimate Partner Violence: Not on file     BP (!) 152/98   Pulse 60   Ht '5\' 7"'$  (6.378 m)   Wt (!) 318 lb (144.2 kg)   SpO2 96%   BMI 49.81 kg/m   Physical Exam:  Well appearing NAD HEENT: Unremarkable Neck:  No JVD, no thyromegally Lymphatics:  No adenopathy Back:  No CVA tenderness Lungs:  Clear with no wheezes HEART:  Regular rate rhythm, no murmurs, no rubs, no clicks Abd:  soft, positive bowel sounds, no organomegally, no rebound, no guarding Ext:  2 plus pulses, no edema, no cyanosis, no clubbing Skin:  No rashes no nodules Neuro:  CN II through XII intact, motor grossly intact  EKG -  NSR  Assess/Plan:  1. SVT - His symptoms are well controlled on coreg. He will undergo watchful waiting. 2. Obesity - I encouraged him to lose weight. 3. Mixed CHF - his symptoms are class 2. His EF has normalized on medical therapy. No change in meds. 4. HTN - his bp is elevated. Weight loss is encouraged as is a low sodium diet.   Carleene Overlie Milta Croson,MD

## 2021-12-12 ENCOUNTER — Ambulatory Visit: Payer: Medicare HMO | Admitting: Internal Medicine

## 2022-11-03 ENCOUNTER — Encounter: Payer: Self-pay | Admitting: Internal Medicine

## 2022-11-03 ENCOUNTER — Ambulatory Visit: Payer: Medicare HMO | Attending: Internal Medicine | Admitting: Internal Medicine

## 2022-11-03 VITALS — BP 120/80 | HR 67 | Ht 67.0 in | Wt 315.0 lb

## 2022-11-03 DIAGNOSIS — I471 Supraventricular tachycardia, unspecified: Secondary | ICD-10-CM | POA: Diagnosis not present

## 2022-11-03 NOTE — Progress Notes (Signed)
HPI [name] returns today for followup of his SVT. 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 Since I saw him last, his SVT has been quiet. He remains active rabbit and deer hunting. He thinks he got 12 this past hunting season. He has class 2 CHF symptoms. His EF has normalized. He is frustrated by his inability to lose weight.    No Known Allergies   Current Outpatient Medications  Medication Sig Dispense Refill   allopurinol (ZYLOPRIM) 300 MG tablet Take 1 tablet (300 mg total) by mouth daily. 30 tablet 5   amLODipine (NORVASC) 10 MG tablet Take 10 mg by mouth daily.     aspirin EC 81 MG tablet Take 81 mg by mouth daily.     atorvastatin (LIPITOR) 40 MG tablet Take 40 mg by mouth daily.     carvedilol (COREG) 25 MG tablet Take 1.5 tablets (37.5 mg total) by mouth 2 (two) times daily with a meal. 270 tablet 3   cloNIDine (CATAPRES) 0.3 MG tablet Take 0.3 mg by mouth 2 (two) times daily.      FARXIGA 10 MG TABS tablet Take 10 mg by mouth daily.     furosemide (LASIX) 40 MG tablet Take 40 mg am and 20 mg pm 135 tablet 3   glimepiride (AMARYL) 2 MG tablet Take 2 mg by mouth daily.     losartan (COZAAR) 100 MG tablet Take 100 mg by mouth daily.     metFORMIN (GLUCOPHAGE) 1000 MG tablet Take 1,000 mg by mouth 2 (two) times daily.     MITIGARE 0.6 MG CAPS      Omega-3 Fatty Acids (FISH OIL) 1000 MG CAPS Take by mouth.     polyethylene glycol (MIRALAX / GLYCOLAX) 17 g packet Take 17 g by mouth daily.     spironolactone (ALDACTONE) 25 MG tablet Take 25 mg by mouth daily.     VICTOZA 18 MG/3ML SOPN Inject into the skin.     VITAMIN D-1000 MAX ST 25 MCG (1000 UT) tablet Take 1,000 Units by mouth daily.     No current facility-administered medications for this visit.     Past Medical History:  Diagnosis Date   Cardiomyopathy [name])    Essential hypertension    GERD  (gastroesophageal reflux disease)    Gout    Mixed hyperlipidemia    Paroxysmal atrial fibrillation (HCC)    Sleep apnea    Type 2 diabetes mellitus (HCC)     ROS:   All systems reviewed and negative except as noted in the HPI.   Past Surgical History:  Procedure Laterality Date   BUNIONECTOMY     COLONOSCOPY WITH PROPOFOL N/A 12/06/2017   Procedure: COLONOSCOPY WITH PROPOFOL;  Surgeon: Wyline Mood, MD;  Location: [facility] ENDOSCOPY;  Service: Gastroenterology;  Laterality: N/A;     Family History  Problem Relation Age of Onset   Hypertension Mother    Diabetes Mother    Heart disease Father    Hypertension Father    Hypertension Sister    Hypertension Brother    Diabetes Brother      Social History   Socioeconomic History   Marital status: Married    Spouse name: Not on file   Number of children: Not on file   Years of education: Not on file   Highest education level: Not on  file  Occupational History   Not on file  Tobacco Use   Smoking status: Never   Smokeless tobacco: Current    Types: Chew  Vaping Use   Vaping Use: Never used  Substance and Sexual Activity   Alcohol use: Yes    Alcohol/week: 21.0 standard drinks of alcohol    Types: 21 Cans of beer per week    Comment: 2-3 beer dailys   Drug use: Never   Sexual activity: Not on file  Other Topics Concern   Not on file  Social History Narrative   Not on file   Social Determinants of Health   Financial Resource Strain: Not on file  Food Insecurity: Not on file  Transportation Needs: Not on file  Physical Activity: Not on file  Stress: Not on file  Social Connections: Not on file  Intimate Partner Violence: Not on file     BP 120/80   Pulse 67   Ht 5\' 7"  (1.702 m)   Wt (!) 315 lb (142.9 kg)   SpO2 94%   BMI 49.34 kg/m   Physical Exam:  overweight appearing NAD HEENT: Unremarkable Neck:  No JVD, no thyromegally Lymphatics:  No adenopathy Back:  No CVA tenderness Lungs:  Clear with  no wheezes HEART:  Regular rate rhythm, no murmurs, no rubs, no clicks Abd:  soft, positive bowel sounds, no organomegally, no rebound, no guarding Ext:  2 plus pulses, no edema, no cyanosis, no clubbing Skin:  No rashes no nodules Neuro:  CN II through XII intact, motor grossly intact  EKG - nsr   Assess/Plan:  SVT - His symptoms are well controlled on coreg. He will undergo watchful waiting. 2. Obesity - I encouraged him to lose weight. 3. Mixed CHF - his symptoms are class 2. His EF has normalized on medical therapy. No change in meds. 4. HTN - his bp is much better today. Weight loss is encouraged as is a low sodium diet.   Sharlot Gowda Taran Haynesworth,MD

## 2022-11-03 NOTE — Patient Instructions (Signed)
Medication Instructions:  Your physician recommends that you continue on your current medications as directed. Please refer to the Current Medication list given to you today.   Labwork: None today  Testing/Procedures: None today  Follow-Up: 1 year  Any Other Special Instructions Will Be Listed Below (If Applicable).  If you need a refill on your cardiac medications before your next appointment, please call your pharmacy.  

## 2023-08-03 DIAGNOSIS — I10 Essential (primary) hypertension: Secondary | ICD-10-CM | POA: Diagnosis not present

## 2023-08-03 DIAGNOSIS — E785 Hyperlipidemia, unspecified: Secondary | ICD-10-CM | POA: Diagnosis not present

## 2023-08-03 DIAGNOSIS — Z1389 Encounter for screening for other disorder: Secondary | ICD-10-CM | POA: Diagnosis not present

## 2023-08-03 DIAGNOSIS — I5042 Chronic combined systolic (congestive) and diastolic (congestive) heart failure: Secondary | ICD-10-CM | POA: Diagnosis not present

## 2023-08-03 DIAGNOSIS — E1165 Type 2 diabetes mellitus with hyperglycemia: Secondary | ICD-10-CM | POA: Diagnosis not present

## 2023-08-03 DIAGNOSIS — Z0131 Encounter for examination of blood pressure with abnormal findings: Secondary | ICD-10-CM | POA: Diagnosis not present

## 2023-08-03 DIAGNOSIS — Z712 Person consulting for explanation of examination or test findings: Secondary | ICD-10-CM | POA: Diagnosis not present

## 2023-09-14 DIAGNOSIS — E1165 Type 2 diabetes mellitus with hyperglycemia: Secondary | ICD-10-CM | POA: Diagnosis not present

## 2023-11-02 DIAGNOSIS — E1165 Type 2 diabetes mellitus with hyperglycemia: Secondary | ICD-10-CM | POA: Diagnosis not present

## 2023-11-02 DIAGNOSIS — Z1389 Encounter for screening for other disorder: Secondary | ICD-10-CM | POA: Diagnosis not present

## 2023-11-17 ENCOUNTER — Ambulatory Visit: Attending: Internal Medicine | Admitting: Internal Medicine

## 2023-11-17 ENCOUNTER — Encounter: Payer: Self-pay | Admitting: Internal Medicine

## 2023-11-17 VITALS — BP 138/86 | HR 64 | Ht 67.0 in | Wt 316.2 lb

## 2023-11-17 DIAGNOSIS — I471 Supraventricular tachycardia, unspecified: Secondary | ICD-10-CM

## 2023-11-17 NOTE — Patient Instructions (Signed)
 Medication Instructions:  Your physician recommends that you continue on your current medications as directed. Please refer to the Current Medication list given to you today.  Consider taking Ozempic   *If you need a refill on your cardiac medications before your next appointment, please call your pharmacy*  Lab Work: NONE   If you have labs (blood work) drawn today and your tests are completely normal, you will receive your results only by: MyChart Message (if you have MyChart) OR A paper copy in the mail If you have any lab test that is abnormal or we need to change your treatment, we will call you to review the results.  Testing/Procedures: NONE   Follow-Up: At [facility], you and your health needs are our priority.  As part of our continuing mission to provide you with exceptional heart care, our providers are all part of one team.  This team includes your primary Cardiologist (physician) and Advanced Practice Providers or APPs (Physician Assistants and Nurse Practitioners) who all work together to provide you with the care you need, when you need it.  Your next appointment:   1 year(s)  Provider:   Danelle Birmingham, MD    We recommend signing up for the patient portal called MyChart.  Sign up information is provided on this After Visit Summary.  MyChart is used to connect with patients for Virtual Visits (Telemedicine).  Patients are able to view lab/test results, encounter notes, upcoming appointments, etc.  Non-urgent messages can be sent to your provider as well.   To learn more about what you can do with MyChart, go to [email or web address].   Other Instructions Thank you for choosing [facility]!

## 2023-11-17 NOTE — Progress Notes (Signed)
 HPI [name] returns today for followup of his SVT. 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 Since I saw him last, his SVT has been quiet. He remains active rabbit and deer hunting. He is saddened as his sister died suddenly yesterday. He has class 2 CHF symptoms. His EF has normalized. He is frustrated by his inability to lose weight.   No Known Allergies   Current Outpatient Medications  Medication Sig Dispense Refill   allopurinol  (ZYLOPRIM ) 300 MG tablet Take 1 tablet (300 mg total) by mouth daily. 30 tablet 5   amLODipine  (NORVASC ) 10 MG tablet Take 10 mg by mouth daily.     aspirin  EC 81 MG tablet Take 81 mg by mouth daily.     atorvastatin  (LIPITOR) 40 MG tablet Take 40 mg by mouth daily.     carvedilol  (COREG ) 25 MG tablet Take 1.5 tablets (37.5 mg total) by mouth 2 (two) times daily with a meal. 270 tablet 3   cloNIDine  (CATAPRES ) 0.3 MG tablet Take 0.3 mg by mouth 2 (two) times daily.      FARXIGA 10 MG TABS tablet Take 10 mg by mouth daily.     furosemide  (LASIX ) 40 MG tablet Take 40 mg am and 20 mg pm 135 tablet 3   glimepiride (AMARYL) 2 MG tablet Take 2 mg by mouth daily.     losartan  (COZAAR ) 100 MG tablet Take 100 mg by mouth daily.     metFORMIN  (GLUCOPHAGE ) 1000 MG tablet Take 1,000 mg by mouth 2 (two) times daily.     MITIGARE 0.6 MG CAPS      Omega-3 Fatty Acids (FISH OIL) 1000 MG CAPS Take by mouth.     polyethylene glycol (MIRALAX  / GLYCOLAX ) 17 g packet Take 17 g by mouth daily.     spironolactone  (ALDACTONE ) 25 MG tablet Take 25 mg by mouth daily.     VICTOZA 18 MG/3ML SOPN Inject into the skin.     VITAMIN D-1000 MAX ST 25 MCG (1000 UT) tablet Take 1,000 Units by mouth daily.     No current facility-administered medications for this visit.     Past Medical History:  Diagnosis Date   Cardiomyopathy [facility])    Essential hypertension     GERD (gastroesophageal reflux disease)    Gout    Mixed hyperlipidemia    Paroxysmal atrial fibrillation (HCC)    Sleep apnea    Type 2 diabetes mellitus (HCC)     ROS:   All systems reviewed and negative except as noted in the HPI.   Past Surgical History:  Procedure Laterality Date   BUNIONECTOMY     COLONOSCOPY WITH PROPOFOL  N/A 12/06/2017   Procedure: COLONOSCOPY WITH PROPOFOL ;  Surgeon: Therisa Bi, MD;  Location: [facility] ENDOSCOPY;  Service: Gastroenterology;  Laterality: N/A;     Family History  Problem Relation Age of Onset   Hypertension Mother    Diabetes Mother    Heart disease Father    Hypertension Father    Hypertension Sister    Hypertension Brother    Diabetes Brother      Social History   Socioeconomic History   Marital status: Married    Spouse name: Not on file   Number of children: Not on file   Years of education: Not on file   Highest education level: Not on file  Occupational History   Not on file  Tobacco Use   Smoking status: Never   Smokeless tobacco: Current    Types: Chew, Snuff  Vaping Use   Vaping status: Never Used  Substance and Sexual Activity   Alcohol use: Yes    Alcohol/week: 21.0 standard drinks of alcohol    Types: 21 Cans of beer per week    Comment: 2-3 beer dailys   Drug use: Never   Sexual activity: Not on file  Other Topics Concern   Not on file  Social History Narrative   Not on file   Social Drivers of Health   Financial Resource Strain: Not on file  Food Insecurity: Not on file  Transportation Needs: Not on file  Physical Activity: Not on file  Stress: Not on file  Social Connections: Not on file  Intimate Partner Violence: Not on file     BP 138/86 (BP Location: Left Arm, Patient Position: Sitting, Cuff Size: Large)   Pulse 64   Ht 5' 7 (1.702 m)   Wt (!) 316 lb 3.2 oz (143.4 kg)   SpO2 96%   BMI 49.52 kg/m   Physical Exam:  Obese appearing NAD HEENT: Unremarkable Neck:  No JVD, no  thyromegally Lymphatics:  No adenopathy Back:  No CVA tenderness Lungs:  Clear with no wheezes HEART:  Regular rate rhythm, no murmurs, no rubs, no clicks Abd:  soft, positive bowel sounds, no organomegally, no rebound, no guarding Ext:  2 plus pulses, no edema, no cyanosis, no clubbing Skin:  No rashes no nodules Neuro:  CN II through XII intact, motor grossly intact  EKG - nsr with left axis   Assess/Plan:  SVT - His symptoms are well controlled on coreg . He will undergo watchful waiting. 2. Obesity - I encouraged him to lose weight. 3. Mixed CHF - his symptoms are class 2. His EF has normalized on medical therapy. No change in meds. 4. HTN - his bp is much better today. Weight loss is encouraged as is a low sodium diet.   Danelle Marcques Wrightsman,MD

## 2024-05-09 ENCOUNTER — Encounter: Payer: Self-pay | Admitting: Family Medicine

## 2024-05-09 ENCOUNTER — Ambulatory Visit (INDEPENDENT_AMBULATORY_CARE_PROVIDER_SITE_OTHER): Admitting: Family Medicine

## 2024-05-09 VITALS — BP 138/86 | HR 65 | Temp 99.3°F | Ht 67.0 in | Wt 319.4 lb

## 2024-05-09 DIAGNOSIS — E1159 Type 2 diabetes mellitus with other circulatory complications: Secondary | ICD-10-CM

## 2024-05-09 DIAGNOSIS — I5022 Chronic systolic (congestive) heart failure: Secondary | ICD-10-CM

## 2024-05-09 DIAGNOSIS — I10 Essential (primary) hypertension: Secondary | ICD-10-CM | POA: Diagnosis not present

## 2024-05-09 DIAGNOSIS — Z7984 Long term (current) use of oral hypoglycemic drugs: Secondary | ICD-10-CM | POA: Diagnosis not present

## 2024-05-09 DIAGNOSIS — G4733 Obstructive sleep apnea (adult) (pediatric): Secondary | ICD-10-CM | POA: Diagnosis not present

## 2024-05-09 DIAGNOSIS — Z125 Encounter for screening for malignant neoplasm of prostate: Secondary | ICD-10-CM

## 2024-05-09 DIAGNOSIS — I38 Endocarditis, valve unspecified: Secondary | ICD-10-CM | POA: Insufficient documentation

## 2024-05-09 DIAGNOSIS — Z8679 Personal history of other diseases of the circulatory system: Secondary | ICD-10-CM | POA: Insufficient documentation

## 2024-05-09 MED ORDER — TIRZEPATIDE 2.5 MG/0.5ML ~~LOC~~ SOAJ: 2.5000 mg | SUBCUTANEOUS | 3 refills | Status: AC

## 2024-05-09 MED ORDER — FUROSEMIDE 20 MG PO TABS: ORAL_TABLET | ORAL | 3 refills | Status: AC

## 2024-05-09 NOTE — Progress Notes (Signed)
 "  Subjective:    Patient ID: [name], male    DOB: July 19, 1960, 63 y.o.   MRN: [id number]  HPI Patient is a very pleasant 63 year old African-American male here today to establish care.  Has a past medical history of congestive heart failure.  Most recent gram and find in his chart shows an ejection fraction of 60%.  He is currently on medical therapy for this including carvedilol  spironolactone , losartan , and Farxiga.  However the patient has not been taking the Farxiga due to cost.  He also has a history of type 2 diabetes.  He is supposed to be treating that with Victoza however he has not been taking the Victoza due to cost.  His blood pressure today is adequately controlled.  He also has a history of sleep apnea however he states that he has not been wearing his CPAP machine because he cannot tolerate the machine at night.  He has a listed history of paroxysmal atrial fibrillation.  At the present time he is only taking aspirin .  He also has a history of gout for which he takes allopurinol  and colchicine as needed.  He states that he had his flu shot as well as his pneumonia shot.  He is due for his shingles vaccine but he declines that today.  Past Medical History:  Diagnosis Date   Cardiomyopathy [facility])    Essential hypertension    GERD (gastroesophageal reflux disease)    Gout    Mixed hyperlipidemia    Paroxysmal atrial fibrillation (HCC)    Sleep apnea    Type 2 diabetes mellitus (HCC)    Past Surgical History:  Procedure Laterality Date   BUNIONECTOMY     COLONOSCOPY WITH PROPOFOL  N/A 12/06/2017   Procedure: COLONOSCOPY WITH PROPOFOL ;  Surgeon: Therisa Bi, MD;  Location: [facility] ENDOSCOPY;  Service: Gastroenterology;  Laterality: N/A;   Medications Ordered Prior to Encounter[1] Allergies[2] Social History   Socioeconomic History   Marital status: Married    Spouse name: Not on file   Number of children: Not on file   Years of education: Not on file   Highest  education level: Not on file  Occupational History   Not on file  Tobacco Use   Smoking status: Never   Smokeless tobacco: Current    Types: Chew, Snuff  Vaping Use   Vaping status: Never Used  Substance and Sexual Activity   Alcohol use: Yes    Alcohol/week: 21.0 standard drinks of alcohol    Types: 21 Cans of beer per week    Comment: 2-3 beer dailys   Drug use: Never   Sexual activity: Not on file  Other Topics Concern   Not on file  Social History Narrative   Not on file   Social Drivers of Health   Tobacco Use: High Risk (05/09/2024)   Patient History    Smoking Tobacco Use: Never    Smokeless Tobacco Use: Current    Passive Exposure: Not on file  Financial Resource Strain: Not on file  Food Insecurity: Not on file  Transportation Needs: Not on file  Physical Activity: Not on file  Stress: Not on file  Social Connections: Not on file  Intimate Partner Violence: Not on file  Depression (EYV7-0): Low Risk (05/09/2024)   Depression (PHQ2-9)    PHQ-2 Score: 0  Alcohol Screen: Not on file  Housing: Not on file  Utilities: Not on file  Health Literacy: Not on file   Family History  Problem Relation  Age of Onset   Hypertension Mother    Diabetes Mother    Heart disease Father    Hypertension Father    Hypertension Sister    Hypertension Brother    Diabetes Brother     Review of Systems  All other systems reviewed and are negative.      Objective:   Physical Exam Constitutional:      General: He is not in acute distress.    Appearance: Normal appearance. He is obese. He is not ill-appearing or toxic-appearing.  Cardiovascular:     Rate and Rhythm: Normal rate and regular rhythm.     Pulses: Normal pulses.     Heart sounds: Normal heart sounds. No murmur heard.    No friction rub. No gallop.  Pulmonary:     Effort: Pulmonary effort is normal. No respiratory distress.     Breath sounds: Normal breath sounds. No wheezing, rhonchi or rales.  Abdominal:      General: Bowel sounds are normal. There is no distension.     Palpations: Abdomen is soft. There is no mass.     Tenderness: There is no abdominal tenderness. There is no right CVA tenderness, left CVA tenderness, guarding or rebound.  Musculoskeletal:     Right lower leg: No edema.     Left lower leg: No edema.  Neurological:     General: No focal deficit present.     Mental Status: He is alert and oriented to person, place, and time. Mental status is at baseline.     Cranial Nerves: No cranial nerve deficit.     Motor: No weakness.     Coordination: Coordination normal.     Deep Tendon Reflexes: Reflexes normal.           Assessment & Plan:   Type 2 diabetes mellitus with other circulatory complication, without long-term current use of insulin  (HCC) - Plan: Hemoglobin A1c, CBC with Differential/Platelet, Comprehensive metabolic panel with GFR, Lipid panel, Microalbumin / creatinine urine ratio  Chronic systolic congestive heart failure (HCC)  Benign essential hypertension  OSA (obstructive sleep apnea)  Obesities, morbid (HCC)  Screening for prostate cancer - Plan: PSA My biggest concern for the patient thus far is his weight as well as compliance with medication due to cost.  I feel that the best option for him would be Mounjaro or Ozempic.  We discussed the pros and cons and he elects to try Mounjaro 2.5 mg subcu weekly and uptitrate every month as tolerated.  I have sent in a prescription for this.  I did refill his current dose of furosemide .  However I would like to obtain a CMP to evaluate his renal function.  If his GFR is less than 40, we may need to discontinue metformin .  I would also like to check a fasting lipid panel.  Goal LDL cholesterol is less than 70 given his history of diabetes.  I will check a microalbumin to creatinine ratio.  If greater than 30 I would strongly recommend resuming Farxiga.  Blood pressure today is controlled.  Encourage compliance with  CPAP.  Hopefully Mounjaro would also help improve his obstructive sleep apnea with weight loss.  I will also screen for prostate cancer.  Recommended shingles vaccine.  Await the results of his fasting lab work.  Check hemoglobin A1c.  Goal hemoglobin A1c is less than 6.5    [1]  Current Outpatient Medications on File Prior to Visit  Medication Sig Dispense Refill   allopurinol  (ZYLOPRIM )  300 MG tablet Take 1 tablet (300 mg total) by mouth daily. 30 tablet 5   amLODipine  (NORVASC ) 10 MG tablet Take 10 mg by mouth daily.     aspirin  EC 81 MG tablet Take 81 mg by mouth daily.     atorvastatin  (LIPITOR) 80 MG tablet Take 80 mg by mouth at bedtime.     carvedilol  (COREG ) 25 MG tablet Take 1.5 tablets (37.5 mg total) by mouth 2 (two) times daily with a meal. 270 tablet 3   cloNIDine  (CATAPRES ) 0.3 MG tablet Take 0.3 mg by mouth 2 (two) times daily.      FARXIGA 10 MG TABS tablet Take 10 mg by mouth daily.     furosemide  (LASIX ) 40 MG tablet Take 40 mg am and 20 mg pm 135 tablet 3   glimepiride (AMARYL) 2 MG tablet Take 2 mg by mouth daily.     losartan  (COZAAR ) 100 MG tablet Take 100 mg by mouth daily.     metFORMIN  (GLUCOPHAGE ) 1000 MG tablet Take 1,000 mg by mouth 2 (two) times daily.     MITIGARE 0.6 MG CAPS      Omega-3 Fatty Acids (FISH OIL) 1000 MG CAPS Take by mouth.     polyethylene glycol (MIRALAX  / GLYCOLAX ) 17 g packet Take 17 g by mouth daily.     spironolactone  (ALDACTONE ) 25 MG tablet Take 25 mg by mouth daily.     VICTOZA 18 MG/3ML SOPN Inject into the skin.     VITAMIN D-1000 MAX ST 25 MCG (1000 UT) tablet Take 1,000 Units by mouth daily.     No current facility-administered medications on file prior to visit.  [2] No Known Allergies  "

## 2024-05-11 LAB — CBC WITH DIFFERENTIAL/PLATELET
Absolute Lymphocytes: 2664 {cells}/uL (ref 850–3900)
Absolute Monocytes: 996 {cells}/uL — ABNORMAL HIGH (ref 200–950)
Basophils Absolute: 60 {cells}/uL (ref 0–200)
Basophils Relative: 0.5 %
Eosinophils Absolute: 192 {cells}/uL (ref 15–500)
Eosinophils Relative: 1.6 %
HCT: 41.4 % (ref 39.4–51.1)
Hemoglobin: 14.4 g/dL (ref 13.2–17.1)
MCH: 30.3 pg (ref 27.0–33.0)
MCHC: 34.8 g/dL (ref 31.6–35.4)
MCV: 87 fL (ref 81.4–101.7)
MPV: 10.3 fL (ref 7.5–12.5)
Monocytes Relative: 8.3 %
Neutro Abs: 8088 {cells}/uL — ABNORMAL HIGH (ref 1500–7800)
Neutrophils Relative %: 67.4 %
Platelets: 195 Thousand/uL (ref 140–400)
RBC: 4.76 Million/uL (ref 4.20–5.80)
RDW: 12.7 % (ref 11.0–15.0)
Total Lymphocyte: 22.2 %
WBC: 12 Thousand/uL — ABNORMAL HIGH (ref 3.8–10.8)

## 2024-05-11 LAB — HEMOGLOBIN A1C
Hgb A1c MFr Bld: 8 % — ABNORMAL HIGH
Mean Plasma Glucose: 183 mg/dL
eAG (mmol/L): 10.1 mmol/L

## 2024-05-11 LAB — COMPREHENSIVE METABOLIC PANEL WITH GFR
AG Ratio: 1.5 (calc) (ref 1.0–2.5)
ALT: 27 U/L (ref 9–46)
AST: 18 U/L (ref 10–35)
Albumin: 4.2 g/dL (ref 3.6–5.1)
Alkaline phosphatase (APISO): 92 U/L (ref 35–144)
BUN: 13 mg/dL (ref 7–25)
CO2: 26 mmol/L (ref 20–32)
Calcium: 9.1 mg/dL (ref 8.6–10.3)
Chloride: 103 mmol/L (ref 98–110)
Creat: 1.01 mg/dL (ref 0.70–1.35)
Globulin: 2.8 g/dL (ref 1.9–3.7)
Glucose, Bld: 133 mg/dL — ABNORMAL HIGH (ref 65–99)
Potassium: 3.5 mmol/L (ref 3.5–5.3)
Sodium: 138 mmol/L (ref 135–146)
Total Bilirubin: 0.8 mg/dL (ref 0.2–1.2)
Total Protein: 7 g/dL (ref 6.1–8.1)
eGFR: 85 mL/min/1.73m2

## 2024-05-11 LAB — LIPID PANEL
Cholesterol: 101 mg/dL
HDL: 38 mg/dL — ABNORMAL LOW
LDL Cholesterol (Calc): 43 mg/dL
Non-HDL Cholesterol (Calc): 63 mg/dL
Total CHOL/HDL Ratio: 2.7 (calc)
Triglycerides: 117 mg/dL

## 2024-05-11 LAB — PSA: PSA: 0.44 ng/mL

## 2024-05-11 LAB — MICROALBUMIN / CREATININE URINE RATIO
Creatinine, Urine: 129 mg/dL (ref 20–320)
Microalb Creat Ratio: 218 mg/g{creat} — ABNORMAL HIGH
Microalb, Ur: 28.1 mg/dL

## 2024-05-12 ENCOUNTER — Ambulatory Visit: Payer: Self-pay | Admitting: Family Medicine

## 2024-09-11 ENCOUNTER — Other Ambulatory Visit
